# Patient Record
Sex: Male | Born: 1960 | Race: White | Hispanic: No | Marital: Single | Smoking: Never smoker
Health system: Southern US, Community
[De-identification: ages and names within clinical notes are randomized; demographics above are authoritative.]

## PROBLEM LIST (undated history)

## (undated) DIAGNOSIS — K219 Gastro-esophageal reflux disease without esophagitis: Secondary | ICD-10-CM

## (undated) DIAGNOSIS — R21 Rash and other nonspecific skin eruption: Secondary | ICD-10-CM

## (undated) DIAGNOSIS — D496 Neoplasm of unspecified behavior of brain: Secondary | ICD-10-CM

## (undated) DIAGNOSIS — M799 Soft tissue disorder, unspecified: Secondary | ICD-10-CM

## (undated) DIAGNOSIS — J342 Deviated nasal septum: Secondary | ICD-10-CM

## (undated) DIAGNOSIS — S46219A Strain of muscle, fascia and tendon of other parts of biceps, unspecified arm, initial encounter: Secondary | ICD-10-CM

## (undated) HISTORY — PX: OTHER SURGICAL HISTORY: SHX169

---

## 2005-02-03 DIAGNOSIS — E119 Type 2 diabetes mellitus without complications: Secondary | ICD-10-CM

## 2005-02-03 HISTORY — DX: Type 2 diabetes mellitus without complications: E11.9

## 2008-05-25 DIAGNOSIS — R9431 Abnormal electrocardiogram [ECG] [EKG]: Secondary | ICD-10-CM | POA: Insufficient documentation

## 2008-05-25 DIAGNOSIS — F411 Generalized anxiety disorder: Secondary | ICD-10-CM | POA: Insufficient documentation

## 2013-02-03 DIAGNOSIS — C801 Malignant (primary) neoplasm, unspecified: Secondary | ICD-10-CM

## 2013-02-03 HISTORY — DX: Malignant (primary) neoplasm, unspecified: C80.1

## 2013-08-24 DIAGNOSIS — E785 Hyperlipidemia, unspecified: Secondary | ICD-10-CM | POA: Insufficient documentation

## 2013-08-24 DIAGNOSIS — D329 Benign neoplasm of meninges, unspecified: Secondary | ICD-10-CM | POA: Insufficient documentation

## 2013-08-24 DIAGNOSIS — I10 Essential (primary) hypertension: Secondary | ICD-10-CM | POA: Insufficient documentation

## 2014-02-03 DIAGNOSIS — Z789 Other specified health status: Secondary | ICD-10-CM

## 2014-02-03 HISTORY — DX: Other specified health status: Z78.9

## 2017-09-16 ENCOUNTER — Ambulatory Visit (INDEPENDENT_AMBULATORY_CARE_PROVIDER_SITE_OTHER): Payer: BC Managed Care – PPO

## 2017-09-16 ENCOUNTER — Encounter (INDEPENDENT_AMBULATORY_CARE_PROVIDER_SITE_OTHER): Payer: Self-pay | Admitting: Sports Medicine

## 2017-09-16 ENCOUNTER — Ambulatory Visit (INDEPENDENT_AMBULATORY_CARE_PROVIDER_SITE_OTHER): Payer: BC Managed Care – PPO | Admitting: Sports Medicine

## 2017-09-16 VITALS — BP 140/84 | HR 98 | Ht 75.0 in | Wt 220.0 lb

## 2017-09-16 DIAGNOSIS — G8929 Other chronic pain: Secondary | ICD-10-CM

## 2017-09-16 DIAGNOSIS — M25512 Pain in left shoulder: Secondary | ICD-10-CM

## 2017-09-16 DIAGNOSIS — M25312 Other instability, left shoulder: Secondary | ICD-10-CM

## 2017-09-16 DIAGNOSIS — M25511 Pain in right shoulder: Secondary | ICD-10-CM
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
## 2017-09-24 ENCOUNTER — Ambulatory Visit: Payer: BC Managed Care – PPO | Attending: Sports Medicine

## 2017-09-24 DIAGNOSIS — M778 Other enthesopathies, not elsewhere classified: Secondary | ICD-10-CM | POA: Insufficient documentation

## 2017-09-24 DIAGNOSIS — M25512 Pain in left shoulder: Secondary | ICD-10-CM | POA: Insufficient documentation

## 2017-09-24 DIAGNOSIS — M25312 Other instability, left shoulder: Secondary | ICD-10-CM | POA: Insufficient documentation

## 2017-09-24 DIAGNOSIS — X58XXXA Exposure to other specified factors, initial encounter: Secondary | ICD-10-CM | POA: Insufficient documentation

## 2017-09-24 DIAGNOSIS — Z9889 Other specified postprocedural states: Secondary | ICD-10-CM | POA: Insufficient documentation

## 2017-09-24 DIAGNOSIS — M65811 Other synovitis and tenosynovitis, right shoulder: Secondary | ICD-10-CM | POA: Insufficient documentation

## 2017-09-24 DIAGNOSIS — M7551 Bursitis of right shoulder: Secondary | ICD-10-CM | POA: Insufficient documentation

## 2017-09-24 DIAGNOSIS — S46811A Strain of other muscles, fascia and tendons at shoulder and upper arm level, right arm, initial encounter: Secondary | ICD-10-CM | POA: Insufficient documentation

## 2017-09-24 DIAGNOSIS — S46812A Strain of other muscles, fascia and tendons at shoulder and upper arm level, left arm, initial encounter: Secondary | ICD-10-CM | POA: Insufficient documentation

## 2017-09-24 DIAGNOSIS — M25511 Pain in right shoulder: Secondary | ICD-10-CM | POA: Insufficient documentation

## 2017-09-24 DIAGNOSIS — M65812 Other synovitis and tenosynovitis, left shoulder: Secondary | ICD-10-CM | POA: Insufficient documentation

## 2017-09-24 DIAGNOSIS — M25811 Other specified joint disorders, right shoulder: Secondary | ICD-10-CM | POA: Insufficient documentation

## 2017-09-24 DIAGNOSIS — M19012 Primary osteoarthritis, left shoulder: Secondary | ICD-10-CM | POA: Insufficient documentation

## 2017-09-24 DIAGNOSIS — G8929 Other chronic pain: Secondary | ICD-10-CM | POA: Insufficient documentation

## 2017-09-24 DIAGNOSIS — E1165 Type 2 diabetes mellitus with hyperglycemia: Secondary | ICD-10-CM | POA: Insufficient documentation

## 2017-10-06 ENCOUNTER — Telehealth: Payer: BC Managed Care – PPO

## 2017-10-06 NOTE — Pre-Procedure Instructions (Addendum)
PSS interview scheduled for 1515, called 1514. Attemtped and started inteview. Unable to complete interview- it in MD office and no able to speak. Pt states he was not told it would take 30-45 minutes. Pt asked to be called back in 45 min, unable to  accommodate pt's request.  [facility] Liaision office emailed to reschedule pt for later today or tomorrow if available oer pt request.   [facility] and Posting faxed for MRSA hx noted in Ortho LOV 09/16/2017 in Epic Notes.

## 2017-10-07 ENCOUNTER — Ambulatory Visit (INDEPENDENT_AMBULATORY_CARE_PROVIDER_SITE_OTHER): Payer: BC Managed Care – PPO | Admitting: Sports Medicine

## 2017-10-07 VITALS — BP 138/90 | HR 91 | Ht 75.0 in | Wt 220.0 lb

## 2017-10-07 DIAGNOSIS — M7522 Bicipital tendinitis, left shoulder: Secondary | ICD-10-CM

## 2017-10-07 DIAGNOSIS — M75122 Complete rotator cuff tear or rupture of left shoulder, not specified as traumatic: Secondary | ICD-10-CM

## 2017-10-07 DIAGNOSIS — M7542 Impingement syndrome of left shoulder: Secondary | ICD-10-CM
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
## 2017-10-08 ENCOUNTER — Other Ambulatory Visit (INDEPENDENT_AMBULATORY_CARE_PROVIDER_SITE_OTHER): Payer: Self-pay | Admitting: Sports Medicine

## 2017-10-08 DIAGNOSIS — M7542 Impingement syndrome of left shoulder: Secondary | ICD-10-CM

## 2017-10-08 DIAGNOSIS — M7522 Bicipital tendinitis, left shoulder: Secondary | ICD-10-CM

## 2017-10-08 DIAGNOSIS — M75122 Complete rotator cuff tear or rupture of left shoulder, not specified as traumatic: Secondary | ICD-10-CM

## 2017-10-08 NOTE — Pre-Procedure Instructions (Signed)
Surgical scheduler notified of patient hx/o MRSA, message left on VM. Pt will need to be on isolation

## 2017-10-09 NOTE — Pre-Procedure Instructions (Signed)
Call placed to PCP office of Dr Ad-Dalli - preop/labs/ekg will be faxed to [facility].  Call placed to neuro office. Spoke to [place]. LOV was faxed a few minutes ago.

## 2017-10-09 NOTE — Pre-Procedure Instructions (Signed)
   Call to pcp office, staff will fax echo.

## 2017-10-09 NOTE — Pre-Procedure Instructions (Signed)
   DB  10/12/17 surgery interview done.   Per PMD medical clearance, ekg, MRSA-nose only, cbc. cmp will fax to [facility].   Spoke with Endo office they will fax lov 09/24/17 and A1c  09/07/17 (was 9.5). email sent to [facility].   Requested lov from neurology to be faxed to [facility]   Attempting to remove MRSA Flag will order MRSA n/t DOS per PMD nasal only done.

## 2017-10-12 ENCOUNTER — Ambulatory Visit: Payer: BC Managed Care – PPO | Admitting: Anesthesiology

## 2017-10-12 ENCOUNTER — Ambulatory Visit
Admission: RE | Admit: 2017-10-12 | Discharge: 2017-10-12 | Disposition: A | Discharge status: Home / Self Care | Payer: BC Managed Care – PPO | Source: Ambulatory Visit | Attending: Otolaryngology | Admitting: Otolaryngology

## 2017-10-12 ENCOUNTER — Encounter
Admission: RE | Disposition: A | Discharge status: Home / Self Care | Payer: Self-pay | Source: Ambulatory Visit | Attending: Otolaryngology

## 2017-10-12 DIAGNOSIS — Z01818 Encounter for other preprocedural examination: Secondary | ICD-10-CM

## 2017-10-12 DIAGNOSIS — J343 Hypertrophy of nasal turbinates: Secondary | ICD-10-CM | POA: Insufficient documentation

## 2017-10-12 DIAGNOSIS — E1140 Type 2 diabetes mellitus with diabetic neuropathy, unspecified: Secondary | ICD-10-CM | POA: Insufficient documentation

## 2017-10-12 DIAGNOSIS — J342 Deviated nasal septum: Secondary | ICD-10-CM | POA: Insufficient documentation

## 2017-10-12 DIAGNOSIS — J3489 Other specified disorders of nose and nasal sinuses: Secondary | ICD-10-CM | POA: Insufficient documentation

## 2017-10-12 DIAGNOSIS — Z7984 Long term (current) use of oral hypoglycemic drugs: Secondary | ICD-10-CM | POA: Insufficient documentation

## 2017-10-12 DIAGNOSIS — E1165 Type 2 diabetes mellitus with hyperglycemia: Secondary | ICD-10-CM | POA: Insufficient documentation

## 2017-10-12 DIAGNOSIS — K219 Gastro-esophageal reflux disease without esophagitis: Secondary | ICD-10-CM | POA: Insufficient documentation

## 2017-10-12 HISTORY — DX: Rash and other nonspecific skin eruption: R21

## 2017-10-12 HISTORY — DX: Gastro-esophageal reflux disease without esophagitis: K21.9

## 2017-10-12 HISTORY — DX: Soft tissue disorder, unspecified: M79.9

## 2017-10-12 HISTORY — DX: Neoplasm of unspecified behavior of brain: D49.6

## 2017-10-12 HISTORY — DX: Deviated nasal septum: J34.2

## 2017-10-12 HISTORY — DX: Strain of muscle, fascia and tendon of other parts of biceps, unspecified arm, initial encounter: S46.219A

## 2017-10-12 LAB — GLUCOSE WHOLE BLOOD - POCT
Whole Blood Glucose POCT: 157 mg/dL — ABNORMAL HIGH (ref 70–100)
Whole Blood Glucose POCT: 169 mg/dL — ABNORMAL HIGH (ref 70–100)

## 2017-10-12 SURGERY — SEPTOPLASTY, NOSE, WITH NASAL TURBINATE SUBMUCOUS EXCISION
Anesthesia: Anesthesia General | Site: Nose | Wound class: Clean Contaminated

## 2017-10-12 MED ORDER — OXYCODONE HCL 5 MG PO TABS
5.0000 mg | ORAL_TABLET | Freq: Once | ORAL | Status: AC | PRN
Start: 2017-10-12 — End: 2017-10-12

## 2017-10-12 MED ORDER — ROCURONIUM BROMIDE 50 MG/5ML IV SOLN
INTRAVENOUS | Status: AC
Start: 2017-10-12 — End: ?
  Filled 2017-10-12: qty 5

## 2017-10-12 MED ORDER — GABAPENTIN 300 MG PO CAPS
300.0000 mg | ORAL_CAPSULE | Freq: Once | ORAL | Status: AC
Start: 2017-10-12 — End: 2017-10-12
  Administered 2017-10-12: 300 mg via ORAL

## 2017-10-12 MED ORDER — MUPIROCIN 2 % EX OINT
TOPICAL_OINTMENT | CUTANEOUS | Status: DC | PRN
Start: 2017-10-12 — End: 2017-10-12
  Administered 2017-10-12: 1 g via TOPICAL

## 2017-10-12 MED ORDER — LIDOCAINE-EPINEPHRINE 1 %-1:100000 IJ SOLN
INTRAMUSCULAR | Status: AC
Start: 2017-10-12 — End: ?
  Filled 2017-10-12: qty 20

## 2017-10-12 MED ORDER — GLYCOPYRROLATE 0.2 MG/ML IJ SOLN
INTRAMUSCULAR | Status: DC | PRN
Start: 2017-10-12 — End: 2017-10-12
  Administered 2017-10-12: 0.4 mg via INTRAVENOUS

## 2017-10-12 MED ORDER — FAMOTIDINE 10 MG/ML IV SOLN (WRAP)
20.0000 mg | Freq: Once | INTRAVENOUS | Status: AC
Start: 2017-10-12 — End: 2017-10-12
  Administered 2017-10-12: 20 mg via INTRAVENOUS

## 2017-10-12 MED ORDER — GABAPENTIN 300 MG PO CAPS
ORAL_CAPSULE | ORAL | Status: AC
Start: 2017-10-12 — End: ?
  Filled 2017-10-12: qty 1

## 2017-10-12 MED ORDER — GLYCOPYRROLATE 0.2 MG/ML IJ SOLN
INTRAMUSCULAR | Status: AC
Start: 2017-10-12 — End: ?
  Filled 2017-10-12: qty 1

## 2017-10-12 MED ORDER — FAMOTIDINE 20 MG/2ML IV SOLN
INTRAVENOUS | Status: AC
Start: 2017-10-12 — End: ?
  Filled 2017-10-12: qty 2

## 2017-10-12 MED ORDER — PROPOFOL 10 MG/ML IV EMUL (WRAP)
INTRAVENOUS | Status: AC
Start: 2017-10-12 — End: ?
  Filled 2017-10-12: qty 20

## 2017-10-12 MED ORDER — LACTATED RINGERS IV SOLN
50.0000 mL/h | INTRAVENOUS | Status: DC
Start: 2017-10-12 — End: 2017-10-12

## 2017-10-12 MED ORDER — NEOSTIGMINE METHYLSULFATE 1 MG/ML IJ/IV SOLN (WRAP)
Status: AC
Start: 2017-10-12 — End: ?
  Filled 2017-10-12: qty 5

## 2017-10-12 MED ORDER — PROPOFOL INFUSION 10 MG/ML
INTRAVENOUS | Status: DC | PRN
Start: 2017-10-12 — End: 2017-10-12
  Administered 2017-10-12: 200 mg via INTRAVENOUS

## 2017-10-12 MED ORDER — DEXAMETHASONE SODIUM PHOSPHATE 4 MG/ML IJ SOLN (WRAP)
INTRAMUSCULAR | Status: DC | PRN
Start: 2017-10-12 — End: 2017-10-12
  Administered 2017-10-12: 4 mg via INTRAVENOUS

## 2017-10-12 MED ORDER — PROMETHAZINE HCL 25 MG/ML IJ SOLN
6.2500 mg | Freq: Once | INTRAMUSCULAR | Status: DC | PRN
Start: 2017-10-12 — End: 2017-10-12

## 2017-10-12 MED ORDER — EPINEPHRINE HCL (NASAL) 0.1 % NA SOLN
NASAL | Status: AC
Start: 2017-10-12 — End: ?
  Filled 2017-10-12: qty 30

## 2017-10-12 MED ORDER — MIDAZOLAM HCL 2 MG/2ML IJ SOLN
INTRAMUSCULAR | Status: DC | PRN
Start: 2017-10-12 — End: 2017-10-12
  Administered 2017-10-12: 2 mg via INTRAVENOUS

## 2017-10-12 MED ORDER — ACETAMINOPHEN 500 MG PO TABS
ORAL_TABLET | ORAL | Status: AC
Start: 2017-10-12 — End: ?
  Filled 2017-10-12: qty 2

## 2017-10-12 MED ORDER — ONDANSETRON HCL 4 MG/2ML IJ SOLN
INTRAMUSCULAR | Status: AC
Start: 2017-10-12 — End: ?
  Filled 2017-10-12: qty 2

## 2017-10-12 MED ORDER — DIPHENHYDRAMINE HCL 50 MG/ML IJ SOLN
12.5000 mg | Freq: Once | INTRAMUSCULAR | Status: DC | PRN
Start: 2017-10-12 — End: 2017-10-12

## 2017-10-12 MED ORDER — GLYCOPYRROLATE 0.2 MG/ML IJ SOLN
INTRAMUSCULAR | Status: AC
Start: 2017-10-12 — End: ?
  Filled 2017-10-12: qty 2

## 2017-10-12 MED ORDER — LIDOCAINE HCL (PF) 2 % IJ SOLN
INTRAMUSCULAR | Status: AC
Start: 2017-10-12 — End: ?
  Filled 2017-10-12: qty 5

## 2017-10-12 MED ORDER — ONDANSETRON HCL 4 MG/2ML IJ SOLN
INTRAMUSCULAR | Status: DC | PRN
Start: 2017-10-12 — End: 2017-10-12
  Administered 2017-10-12: 4 mg via INTRAVENOUS

## 2017-10-12 MED ORDER — FENTANYL CITRATE (PF) 50 MCG/ML IJ SOLN (WRAP)
INTRAMUSCULAR | Status: AC
Start: 2017-10-12 — End: ?
  Filled 2017-10-12: qty 2

## 2017-10-12 MED ORDER — LIDOCAINE-EPINEPHRINE 1 %-1:100000 IJ SOLN
INTRAMUSCULAR | Status: DC | PRN
Start: 2017-10-12 — End: 2017-10-12
  Administered 2017-10-12: 7 mL

## 2017-10-12 MED ORDER — DEXAMETHASONE SODIUM PHOSPHATE 20 MG/5ML IJ SOLN
INTRAMUSCULAR | Status: AC
Start: 2017-10-12 — End: ?
  Filled 2017-10-12: qty 5

## 2017-10-12 MED ORDER — HYDROCODONE-ACETAMINOPHEN 5-325 MG PO TABS
1.00 | ORAL_TABLET | Freq: Four times a day (QID) | ORAL | 0 refills | Status: AC | PRN
Start: 2017-10-12 — End: 2017-10-19

## 2017-10-12 MED ORDER — ONDANSETRON HCL 4 MG/2ML IJ SOLN
4.0000 mg | Freq: Once | INTRAMUSCULAR | Status: DC | PRN
Start: 2017-10-12 — End: 2017-10-12

## 2017-10-12 MED ORDER — MIDAZOLAM HCL 2 MG/2ML IJ SOLN
INTRAMUSCULAR | Status: AC
Start: 2017-10-12 — End: ?
  Filled 2017-10-12: qty 2

## 2017-10-12 MED ORDER — NEOSTIGMINE METHYLSULFATE 1 MG/ML IJ/IV SOLN (WRAP)
Status: DC | PRN
Start: 2017-10-12 — End: 2017-10-12
  Administered 2017-10-12: 3 mg via INTRAVENOUS

## 2017-10-12 MED ORDER — MEPERIDINE HCL 25 MG/ML IJ SOLN
12.5000 mg | Freq: Once | INTRAMUSCULAR | Status: DC | PRN
Start: 2017-10-12 — End: 2017-10-12

## 2017-10-12 MED ORDER — EPINEPHRINE HCL (NASAL) 0.1 % NA SOLN
NASAL | Status: DC | PRN
Start: 2017-10-12 — End: 2017-10-12
  Administered 2017-10-12: 35 mL via NASAL

## 2017-10-12 MED ORDER — OXYCODONE HCL 5 MG PO TABS
ORAL_TABLET | ORAL | Status: AC
Start: 2017-10-12 — End: 2017-10-12
  Administered 2017-10-12: 5 mg via ORAL
  Filled 2017-10-12: qty 1

## 2017-10-12 MED ORDER — CLINDAMYCIN PHOSPHATE IN D5W 600 MG/50ML IV SOLN
INTRAVENOUS | Status: AC
Start: 2017-10-12 — End: ?
  Filled 2017-10-12: qty 50

## 2017-10-12 MED ORDER — LIDOCAINE HCL 2 % IJ SOLN
INTRAMUSCULAR | Status: DC | PRN
Start: 2017-10-12 — End: 2017-10-12
  Administered 2017-10-12: 80 mg via INTRAVENOUS

## 2017-10-12 MED ORDER — LACTATED RINGERS IV SOLN
INTRAVENOUS | Status: DC
Start: 2017-10-12 — End: 2017-10-12
  Administered 2017-10-12: 20 mL/h via INTRAVENOUS

## 2017-10-12 MED ORDER — FENTANYL CITRATE (PF) 50 MCG/ML IJ SOLN (WRAP)
INTRAMUSCULAR | Status: DC | PRN
Start: 2017-10-12 — End: 2017-10-12
  Administered 2017-10-12: 50 ug via INTRAVENOUS

## 2017-10-12 MED ORDER — CLINDAMYCIN PHOSPHATE IN D5W 600 MG/50ML IV SOLN
600.00 mg | INTRAVENOUS | Status: AC
Start: 2017-10-12 — End: 2017-10-12
  Administered 2017-10-12: 600 mg via INTRAVENOUS

## 2017-10-12 MED ORDER — AMMONIA AROMATIC IN INHA
1.0000 | Freq: Once | RESPIRATORY_TRACT | Status: DC | PRN
Start: 2017-10-12 — End: 2017-10-12

## 2017-10-12 MED ORDER — ACETAMINOPHEN 500 MG PO TABS
1000.0000 mg | ORAL_TABLET | Freq: Once | ORAL | Status: AC
Start: 2017-10-12 — End: 2017-10-12
  Administered 2017-10-12: 1000 mg via ORAL

## 2017-10-12 MED ORDER — EPHEDRINE SULFATE 50 MG/ML IJ/IV SOLN (WRAP)
Status: AC
Start: 2017-10-12 — End: ?
  Filled 2017-10-12: qty 1

## 2017-10-12 MED ORDER — ROCURONIUM BROMIDE 10 MG/ML IV SOLN (WRAP)
INTRAVENOUS | Status: DC | PRN
Start: 2017-10-12 — End: 2017-10-12
  Administered 2017-10-12: 40 mg via INTRAVENOUS

## 2017-10-12 MED ORDER — MUPIROCIN 2 % EX OINT
TOPICAL_OINTMENT | CUTANEOUS | Status: AC
Start: 2017-10-12 — End: ?
  Filled 2017-10-12: qty 22

## 2017-10-12 MED ORDER — FENTANYL CITRATE (PF) 50 MCG/ML IJ SOLN (WRAP)
25.0000 ug | INTRAMUSCULAR | Status: DC | PRN
Start: 2017-10-12 — End: 2017-10-12

## 2017-10-12 MED ORDER — PHENYLEPHRINE HCL 10 MG/ML IV SOLN (WRAP)
Status: DC | PRN
Start: 2017-10-12 — End: 2017-10-12
  Administered 2017-10-12 (×2): 100 ug via INTRAVENOUS

## 2017-10-12 MED ORDER — EPHEDRINE SULFATE 50 MG/ML IJ/IV SOLN (WRAP)
Status: DC | PRN
Start: 2017-10-12 — End: 2017-10-12
  Administered 2017-10-12: 5 mg via INTRAVENOUS

## 2017-10-12 SURGICAL SUPPLY — 29 items
BLADE INFERIOR TURBINATE 2MM (ENT Supplies) ×1
BLADE S/SU RIBBACK CARB STL 15 (Blade) ×2 IMPLANT
BLADE SHAVER OD2 MM STRAIGHT SHAFT (ENT Supplies) ×1
BLADE SHAVER OD2 MM STRAIGHT SHAFT ELEVATOR IRRIGATION TUBE (ENT Supplies) ×1 IMPLANT
GLOVE SURG BIOGEL SZ7.5 (Glove) ×2 IMPLANT
SLEEVE CMPR MED KN LGTH KDL SCD 21- IN (Sleeve) ×1
SLEEVE COMPRESSION MEDIUM KNEE LENGTH KENDALL SEQUENTIAL OD21- IN (Sleeve) ×1 IMPLANT
SLEEVE SEQ COMP KNEE REGULAR (Sleeve) ×1
SPLINT DOYLE II INTRANASAL (Splint) ×2 IMPLANT
STAPLER SEPTAL ENTACT (Staplers) ×1
STAPLER SEPTAL ENTACT INTERNAL (Staplers) ×1 IMPLANT
SUCTION COAGULATOR FOOT CNTRL (Suction) ×2 IMPLANT
SUTURE CHROMIC 4-0 RB1 27IN (Suture) ×1
SUTURE CHROMIC 4-0 SC1 SC118IN (Suture) ×1
SUTURE CHROMIC GUT CHROMIC 4-0 RB-1 L27 (Suture) ×1
SUTURE CHROMIC GUT CHROMIC 4-0 RB-1 L27 IN MONOFILAMENT BROWN (Suture) ×1 IMPLANT
SUTURE PLAIN 4-0 SC-1 (Suture) ×1
SUTURE PLAIN GUT GUT PLAIN 4-0 SC-1 L18 (Suture) ×1
SUTURE PLAIN GUT GUT PLAIN 4-0 SC-1 L18 IN MONOFILAMENT YELLOWISH TAN (Suture) ×1 IMPLANT
SUTURE PLAIN GUT PLAIN 4-0 SC-1 L18 IN 2 (Suture) ×1
SUTURE PLAIN GUT PLAIN 4-0 SC-1 L18 IN 2 ARM MONOFILAMENT YELLOWISH (Suture) ×1 IMPLANT
SUTURE PROLENE 3-0 KS 30IN (Suture) ×2 IMPLANT
SYRINGE 10 ML CONTROL CONCENTRIC TIP (Syringes, Needles) ×1
SYRINGE 10 ML CONTROL CONCENTRIC TIP PYROGEN FREE DEHP FREE LOK (Syringes, Needles) ×1 IMPLANT
SYRINGE LUER-LOK CONTROL 10ML (Syringes, Needles) ×1
TRAY NASAL ORAL HEAD AND NECK (Pack) ×2 IMPLANT
TUBING CONNECTING STERILE 10FT (Tubing) ×1
TUBING SUCTION ID3/16 IN L10 FT (Tubing) ×1
TUBING SUCTION ID3/16 IN L10 FT NONCONDUCTIVE STRAIGHT MALE FEMALE (Tubing) ×1 IMPLANT

## 2017-10-12 NOTE — Anesthesia Preprocedure Evaluation (Signed)
Anesthesia Evaluation    AIRWAY    Mallampati: III    TM distance: >3 FB  Neck ROM: full  Mouth Opening:full   CARDIOVASCULAR    regular and normal       DENTAL    no notable dental hx    (+) implants   PULMONARY    clear to auscultation     OTHER FINDINGS                  Relevant Problems   No relevant active problems       PSS Anesthesia Comments:   Deviated nasal septum  DM  GERD        Anesthesia Plan    ASA 3     general                     Detailed anesthesia plan: general endotracheal        Post op pain management: per surgeon    informed consent obtained                   Signed by: Duard Larsen 10/12/17 11:40 AM

## 2017-10-12 NOTE — Addendum Note (Signed)
Addendum  created 10/12/17 1849 by Alycia Patten, CRNA    Anesthesia Event edited

## 2017-10-12 NOTE — Transfer of Care (Signed)
Anesthesia Transfer of Care Note    Patient: Daniel Hebert    Procedures performed: Procedure(s) with comments:  SEPTOPLASTY SMR, TURBINATES - SEPTOPLASTY, INFERIOR TURBINATE RESECTION    Anesthesia type: General ETT    Patient location:Phase I PACU    Last vitals:   Vitals:    10/12/17 1402   BP: (!) 155/96   Pulse: 83   Resp: 20   Temp: (!) 36 C (96.8 F)   SpO2: 97%       Post pain: Patient not complaining of pain, continue current therapy      Mental Status:awake    Respiratory Function: tolerating room air    Cardiovascular: stable    Nausea/Vomiting: patient not complaining of nausea or vomiting    Hydration Status: adequate    Post assessment: no apparent anesthetic complications, no reportable events and no evidence of recall    Signed by: Alycia Patten  10/12/17 2:02 PM

## 2017-10-12 NOTE — Anesthesia Postprocedure Evaluation (Signed)
Anesthesia Post Evaluation    Patient: Daniel Hebert    Procedure(s) with comments:  SEPTOPLASTY SMR, TURBINATES - SEPTOPLASTY, INFERIOR TURBINATE RESECTION    Anesthesia type: general    Last Vitals:   Vitals:    10/12/17 1500   BP: 147/85   Pulse: (!) 117   Resp: 18   Temp: 36.1 C (97 F)   SpO2: 98%       Anesthesia Post Evaluation:                   Anesthetic complications: No                      Anesthesia Qualified Clinical Data Registry 2018    PACU Reintubation  Did the Patient have general anesthesia with intubation: Yes  Did the Patient require reintubation in the PACU?: No      PONV Adult  Is the patient aged 71 or older: Yes  Did the patient receive recieve a general anesthestic: Yes  Does the patient have 3 or more risk factors for PONV? No        PONV Pediatric  Is the patient aged 28-17? No            PACU Transfer Checklist Protocol  Was the patient transferred to the PACU at the conclusion of surgery? Yes  Was a checklist or transfer protocol used? Yes    ICU Transfer Checklist Protocol  Was the patient transferred to the ICU at the conclusion of surgery? No      Post-op Pain Assessment Prior to Anesthesia Care End  Age >=18 and assessed for pain in PACU: Yes        Perioperative Mortality  Perioperative mortality prior to Anesthesia end time: No    Perioperative Cardiac Arrest  Did the patient have an unanticipated intraoperative cardiac arrest between anesthesia start time and anesthesia end time? No    Unplanned Admission to ICU  Did the patient have an unplanned admission to the ICU (not initially anticipated at anesthesia start time)? No      Signed by: Duard Larsen, 10/12/2017 3:33 PM

## 2017-10-12 NOTE — H&P (Signed)
Patient seen and examined.  No changes to scanned in H&P.  Plan to proceed with septoplasty and bilateral inferior turbinate reduction.  Patient understands benefits, risks, alternatives to procedure.    H. Taren Dymek

## 2017-10-12 NOTE — Discharge Instructions (Signed)
Anesthesia Discharge Instructions: After Your Surgery  You've just had surgery. During surgery you were given medicine called anesthesia to keep you relaxed and comfortable. After surgery you may have some pain or nausea. This is common. Your doctor or nurse will show you how to take care of yourself when you go home. He or she will also answer your questions. Here are some tips for feeling better and getting well after surgery.        For the first 24 hours after your surgery:   Do not drive or use heavy equipment.    Do not make important decisions or sign legal papers.   Do not drink alcohol.   Have someone stay with you, if needed. He or she can watch for problems and help keep you safe.   Have an adult family member or friend drive you home.    Managing pain  If you have pain after surgery, pain medicine will help you feel better. Take it as ordered by your surgeon, before pain becomes severe. Also, ask your doctor about other ways to control pain. This might be with rest, ice, repositioning, and elevation. Follow any other instructions your surgeon or nurse gives you.    Tips for taking pain medicine:    Stay on schedule with your medication   Most pain relievers taken by mouth need at least 20 to 30 minutes to start to work.   Taking medicine on a schedule can help you remember to take it. Try to time your medicine so that you can take it before starting an activity. This might be before you get dressed, go for a walk, or sit down for dinner.    Common side effects of prescription pain medications   Pain medicines can cause nausea. Eat a little food before taking pain medicine to avoid this.   Constipation is a common side effect of pain medicines. Drinking lots of fluids and eating foods, such as fruits and vegetables, that are high in fiber can also help.    Call your doctor before taking any medicines such as laxatives or stool softeners to help ease constipation. Also, ask if you should avoid  any foods. Remember, do not take laxatives unless your surgeon has prescribed them   Drinking alcohol and taking pain medicines can cause dizziness and slow your breathing. It can even be deadly. Do not drink alcohol while taking pain medicines.   Pain medicines can make you react more slowly to things. Do not drive or run machinery while taking pain medicines.    Important facts about Acetaminophen (Tylenol)   Acetaminophen or Tylenol is a common pain reliever.   Check your prescription pain medication labels to see if it contains acetaminophen.   Your health care provider may tell you to take acetaminophen to help ease your pain. Ask him or her how much you should to take each day.    Some prescription pain medicines have acetaminophen and other ingredients. Using both prescription pain medicines and over the counter (OTC) acetaminophen for pain can cause you to overdose.   Max dose of acetaminophen is 4000mg/24 hours for most people. Overdosing on acetaminophen may lead to liver failure.   Read the labels on your (over the counter) medicines with care. This will help you to clearly know the list of ingredients, how much to take, and any warnings. This will prevent you from taking too much acetaminophen.   If you have questions or do not understand the   information, ask your pharmacist or health care provider to explain it to you before you take the OTC medicine.    Managing nausea  Some people have an upset stomach after surgery. This is often because of anesthesia, pain, pain medicines, or the stress of surgery. If you were on a special food plan before surgery, ask your doctor if you should follow it while you get better. The following are tips to help you manage nausea after anesthesia:   Do not push yourself to eat. Your body will tell you when to eat and how much.   Start off with clear liquids and soup. They are easier to digest.   Next try semi-solid foods such as mashed potatoes, applesauce,  and gelatin, as you feel ready.   Slowly move to solid foods. Don't eat fatty, rich, or spicy foods at first.   Do not force yourself to have 3 large meals a day. Instead eat smaller amounts more often.   Take pain medicines with a small amount of food, such as crackers or toast, to avoid nausea.     Call your surgeon if:   You have worsening pain an hour after taking pain medicine. The pain medicine may not be strong enough.   You feel too sleepy, dizzy, or groggy. The pain medicine may be too strong.   You have side effects like persistent nausea, vomiting, or skin changes such as rash, itching, or hives.    You have bleeding through your dressing or your dressing becomes saturated.   You have signs of infection such as redness, fever, or increased/foul smelling drainage.      If you have obstructive sleep apnea (OSA)  You were given anesthesia medicine during surgery to keep you comfortable and free of pain. After surgery, you may have more apnea spells because of this medicine and other medicines you were given. The spells may last longer than usual.   At home:   Keep using the continuous positive airway pressure (CPAP) device when you sleep. Unless your health care provider tells you not to, use it when you sleep,        or take a nap; day or night. CPAP is a common device used to treat OSA.  You are to sleep upright- with HOB > 30 degrees, with pillows; or use a recliner          for the first week or while on opioids or medications that are sedating.  Another adult needs to be with you during first 24 hours home after surgery.  Limit opioid use when possible and use alternative comfort measures.  Talk with your provider before taking any pain medicine, muscle relaxants, or sedatives. Your provider will tell you about the possible dangers of taking these medicines.      Nasal Surgery: Septoplasty    You're scheduled to have nasal surgery. The type of nasal surgery you're having is called septoplasty.  Read on to learn more about what to expect during this surgery.During surgery, the surgeon may remove cartilage and boneto reshape the deviated septum. After surgery, there is more breathing space. Enough cartilage and bone remain to give the nose support.  What to expect during septoplasty  This surgery repairs a blockage inside the nose caused by a deviated septum. With a deviated septum, there is a problem with the wall that divides the nose into two chambers. A deviated septum may block air coming through one or both nostrils. This makes it harder  for you to breathe through your nose. During septoplasty, the surgeon makesincisionsinside the nose. Then the surgeon trims, reshapes, moves, or removescartilage and sometimes bone from the septum.  Risks and possible complications  As with any surgery, nasal surgery has some risks. These include a slight risk of bleeding and infection. Your doctor will discuss any other risks and complications with you.   After septoplasty  After septoplasty, you'll be taken to a recovery area or to your hospital room. Your experience may be as follows:   You may have packing material inside your nose. This reduces bleeding and promotes healing. You may also have bandages (dressings) on the outside of your nose.   It's normal to have some mucus and blood drain from your nose. Until packing is removed, you may have to breathe through your mouth.   You may have some swelling or bruising around the eyelids if a rhinoplasty was also done.   Expect some throat dryness and irritation.   Pain medicine will be prescribed as needed. Don't take medicine that contains aspirin or ibuprofen. These can cause increased bleeding.  Follow-up care  You'll need to follow up with your doctor within a week after your surgery. Here is what to expect:   Any packing, splint, or dressings will probably be removed. You may feel slight discomfort and bleed a little when this is done.   After the  splint or packing is removed, you'll most likely breathe better than you did before surgery.   You may have minor numbness, pain, swelling, and a little stiffness under the tip of the nose.   In a few days, the inside of your nose may swell and briefly block your breathing. Or a scab or crust may block breathing for a short time. Leave the scab alone. Your doctor can remove it. Using saline (irrigation or aerosol) regularly after surgery helps to reduce the amount of crusting at each visit.   Contact your healthcare provider if you have any questions or concerns.  Date Last Reviewed: 12/05/2014   2000-2019 [employer], [place]. [address], [place], [place] [address]. All rights reserved. This information is not intended as a substitute for professional medical care. Always follow your healthcare professional's instructions.

## 2017-10-12 NOTE — Discharge Instr - AVS First Page (Signed)
See references.  Expect bleeding for 24-48 hrs.

## 2017-10-12 NOTE — Op Note (Signed)
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  Patient offered septoplasty with inferior turbinate reduction after nasal obstruction not controlled with medical therapy.  The benefits and risks of the procedure were explained to the patient.  Risks include pain, bleeding, infection, persistent deviation, scar tissue, septal perforation, numbness in teeth/lips, CSF leak and other unforseen risks.  Patient freely consented to procedure.      Operative Notes:   The patient was brought into the operating room and placed supine on the operating room table. After appropriate timeout was performed, the patient was turned over to the anesthesia service for successful induction of general anesthesia and placement of an endotracheal tube. The patient then was turned back to the otolaryngology service for the duration of the procedure. The vibrissae were trimmed and 8cc 1% Lidocaine with 1:100,000 epinepherine injected into the septum and inferior turbinates. 1:1000 topical Adrenaline was used to decongest and vasoconstrict.     Attention was first focused to the septum. A hemitransfixion incision was made on the left side and dissection was carried down to the cartilage and mucoperichondrial flap was raised on the left side.  A 15 blade was then used to incise the cartilage and  the contralateral mucoperichondrial flap was raised. The deviated portion of cartilage and bone were then removed using septal scissors and osteotomes. Careful attention was paid to make a 1.5 cm "L-strut" to ensure nasal support. A zero degree endoscope was used to assure proper nasal airway.     Attention was then focused to the inferior turbinates. The zero degree endoscope was brought into the field The right inferior turbinate was infractured with a freer elevator and then a microdebrider with inferior turbinate blade was used to puncture the mucoperiosteum and resect bulkiness of turbinates. The turbinate was noted to be much smaller and then outfractured. Similar procedure was done on the left side with infracturing followed by debridement of bulky turbinates and then outfracturing.     The hemitransfixion incision was then reapproximated using 4-0 chromic in an interrupted fashion. The septum was then quilted back together using a vicryl septal stapler. Doyle splints were placed on either side of the mucoperichondrial flaps and sutured in place using a 3-0 Prolene suture. The patient was then turned over to anesthesia service and awoken and extubated.  He tolerated the procedure well. He was transferred to recovery in a stable fashion.         Estimated Blood Loss:   * No values recorded between 10/12/2017 12:36 PM and 10/12/2017  1:54 PM *    Implants:   * No implants in log *    Drains:   Drains: no    Specimens:   *  No specimens in log *      Complications:   None    Signed by: Neldon Labella, MD

## 2017-10-13 ENCOUNTER — Encounter: Payer: Self-pay | Admitting: Otolaryngology

## 2017-10-13 LAB — MRSA CULTURE
Culture MRSA Surveillance: NEGATIVE
Culture MRSA Surveillance: NEGATIVE

## 2017-10-27 ENCOUNTER — Ambulatory Visit: Admit: 2017-10-27 | Payer: BC Managed Care – PPO | Admitting: Sports Medicine

## 2017-10-27 SURGERY — ARTHROSCOPIC SHOULDER, ROTATOR CUFF REPAIR
Anesthesia: General | Site: Shoulder | Laterality: Left

## 2017-11-05 ENCOUNTER — Telehealth (INDEPENDENT_AMBULATORY_CARE_PROVIDER_SITE_OTHER): Payer: Self-pay

## 2017-11-09 ENCOUNTER — Ambulatory Visit (INDEPENDENT_AMBULATORY_CARE_PROVIDER_SITE_OTHER): Payer: BC Managed Care – PPO | Admitting: Orthopaedic Surgery

## 2017-11-09 ENCOUNTER — Encounter (INDEPENDENT_AMBULATORY_CARE_PROVIDER_SITE_OTHER): Payer: Self-pay | Admitting: Orthopaedic Surgery

## 2017-11-09 VITALS — BP 131/88 | HR 97 | Ht 75.0 in | Wt 218.0 lb

## 2017-11-09 DIAGNOSIS — M75122 Complete rotator cuff tear or rupture of left shoulder, not specified as traumatic: Secondary | ICD-10-CM

## 2017-11-11 NOTE — Progress Notes (Signed)
Chief complaint:bilateral shoulder pain    History:  57 year old male was seen my colleague for the bilateral shoulders is a history of right rotator cuff repair. recent MRI shows a left rotator cuff tear.  Dr. Lorra Hals had talked to him about repairing this.  The patient is concerned because he is having newer pain in his right biceps.  He feels that "something is wrong with the biceps".  He does not want to have anything done with the left shoulder, until the right shoulder is addressed.    Physical examination:  Alert and oriented male in no acute distress afebrile, vital signs stable.  Examination of the bilateral upper extremities reveals he is sensory and vascularly intact with 5 out of 5 strength in all major muscle groups.  Left shoulder shows pain with resisted forward elevation and some weakness.  Right shoulder shows previous incisions.  There does appear to be a clear Popeye deformity consistent with a proximal biceps tendon tenotomy.  He claims deformities knee.    Imaging: MR left shoulder shows rotator cuff tendon tear, acromioclavicular joint arthritis and biceps tendinitis.  MRI right shoulder shows evidence of previous rotator cuff repair, biceps tenotomy.     Impression and plan:  57 year old male left rotator cuff tendon tear.  He is concerned about the right shoulder.  Presumably he may have had a biceps tenodesis here at the time of his surgery, which has gone on to auto tenotomy.  I tried to explain that I do not think there is really anything surgical that can be done here.  I would certainly not try to repair the tenotomy. He was unhappy with this.  I'm going to give him some information for other shoulder specialists in the area to obtain a 3rd opinion.

## 2018-10-29 ENCOUNTER — Telehealth (INDEPENDENT_AMBULATORY_CARE_PROVIDER_SITE_OTHER): Payer: Self-pay

## 2018-10-29 NOTE — Telephone Encounter (Signed)
Daniel Hebert called: he has ongoing problems with his arm and shoulder and wants an IME to review the disability rating that was recently performed. He was having difficulties in finding an MD but has recently located one to perform a surgery. I referred him to [facility] or [facility] as our MDs do not perform the IMEs.

## 2018-11-21 DIAGNOSIS — F3160 Bipolar disorder, current episode mixed, unspecified: Secondary | ICD-10-CM | POA: Insufficient documentation

## 2018-11-21 DIAGNOSIS — J302 Other seasonal allergic rhinitis: Secondary | ICD-10-CM | POA: Insufficient documentation

## 2019-03-04 DIAGNOSIS — M7521 Bicipital tendinitis, right shoulder: Secondary | ICD-10-CM | POA: Insufficient documentation

## 2019-03-04 DIAGNOSIS — M12811 Other specific arthropathies, not elsewhere classified, right shoulder: Secondary | ICD-10-CM | POA: Insufficient documentation

## 2019-03-07 DIAGNOSIS — H547 Unspecified visual loss: Secondary | ICD-10-CM | POA: Insufficient documentation

## 2019-03-07 DIAGNOSIS — Z22322 Carrier or suspected carrier of Methicillin resistant Staphylococcus aureus: Secondary | ICD-10-CM | POA: Insufficient documentation

## 2019-03-15 DIAGNOSIS — Z9889 Other specified postprocedural states: Secondary | ICD-10-CM | POA: Insufficient documentation

## 2019-03-16 DIAGNOSIS — E119 Type 2 diabetes mellitus without complications: Secondary | ICD-10-CM | POA: Insufficient documentation

## 2019-03-17 ENCOUNTER — Ambulatory Visit (FREE_STANDING_LABORATORY_FACILITY): Payer: BC Managed Care – PPO

## 2019-03-17 DIAGNOSIS — Z01818 Encounter for other preprocedural examination: Secondary | ICD-10-CM

## 2019-03-17 DIAGNOSIS — Z20822 Contact with and (suspected) exposure to covid-19: Secondary | ICD-10-CM

## 2019-03-18 LAB — COVID-19 (SARS-COV-2): SARS CoV 2 Overall Result: NOT DETECTED

## 2019-04-14 DIAGNOSIS — H029 Unspecified disorder of eyelid: Secondary | ICD-10-CM | POA: Insufficient documentation

## 2019-05-14 ENCOUNTER — Ambulatory Visit (INDEPENDENT_AMBULATORY_CARE_PROVIDER_SITE_OTHER): Payer: BC Managed Care – PPO

## 2019-05-16 ENCOUNTER — Telehealth: Payer: Self-pay

## 2019-05-16 NOTE — Telephone Encounter (Addendum)
COVID-19 Test Results Call Center    Received call from [facility] for Dr. Carleene Cooper at [phone] requesting Carney Bern COVID-19 test results.    Verified patient's name, DOB, and address.     Reviewed chart and no COVID-19 test result in Epic at this time. Updated Toniann Fail no test result and she stated she will follow up with patient to inquire on his test.       Faylene Million BSN, RN  COVID-19 Notification Team  Direct [phone]  COVID-19 Call Center:[phone]

## 2019-09-02 DIAGNOSIS — M5414 Radiculopathy, thoracic region: Secondary | ICD-10-CM | POA: Insufficient documentation

## 2019-09-02 DIAGNOSIS — E1142 Type 2 diabetes mellitus with diabetic polyneuropathy: Secondary | ICD-10-CM | POA: Insufficient documentation

## 2019-09-02 DIAGNOSIS — M5136 Other intervertebral disc degeneration, lumbar region: Secondary | ICD-10-CM | POA: Insufficient documentation

## 2020-04-03 DIAGNOSIS — H02831 Dermatochalasis of right upper eyelid: Secondary | ICD-10-CM | POA: Insufficient documentation

## 2020-04-03 DIAGNOSIS — H02834 Dermatochalasis of left upper eyelid: Secondary | ICD-10-CM | POA: Insufficient documentation

## 2020-04-03 DIAGNOSIS — H57813 Brow ptosis, bilateral: Secondary | ICD-10-CM | POA: Insufficient documentation

## 2020-04-03 DIAGNOSIS — H02403 Unspecified ptosis of bilateral eyelids: Secondary | ICD-10-CM | POA: Insufficient documentation

## 2020-05-08 ENCOUNTER — Ambulatory Visit (INDEPENDENT_AMBULATORY_CARE_PROVIDER_SITE_OTHER): Payer: BC Managed Care – PPO | Admitting: Podiatry

## 2020-05-08 ENCOUNTER — Other Ambulatory Visit: Payer: Self-pay

## 2020-05-08 ENCOUNTER — Ambulatory Visit (INDEPENDENT_AMBULATORY_CARE_PROVIDER_SITE_OTHER): Payer: BC Managed Care – PPO

## 2020-05-08 DIAGNOSIS — E1165 Type 2 diabetes mellitus with hyperglycemia: Secondary | ICD-10-CM

## 2020-05-08 DIAGNOSIS — S92901A Unspecified fracture of right foot, initial encounter for closed fracture: Secondary | ICD-10-CM | POA: Diagnosis not present

## 2020-05-08 DIAGNOSIS — S93621S Sprain of tarsometatarsal ligament of right foot, sequela: Secondary | ICD-10-CM

## 2020-05-08 DIAGNOSIS — M14671 Charcot's joint, right ankle and foot: Secondary | ICD-10-CM | POA: Diagnosis not present

## 2020-05-08 DIAGNOSIS — E11610 Type 2 diabetes mellitus with diabetic neuropathic arthropathy: Secondary | ICD-10-CM | POA: Diagnosis not present

## 2020-05-11 ENCOUNTER — Ambulatory Visit: Payer: BC Managed Care – PPO | Admitting: Podiatry

## 2020-05-17 ENCOUNTER — Telehealth: Payer: Self-pay | Admitting: Podiatry

## 2020-05-17 NOTE — Telephone Encounter (Signed)
Pt is extremely upset with his care. Pt was in the office on 05/08/20 and was told that a MRI would be order. Pt called over to [facility] imaging to see if he could schedule his MRI and was told that there was not an order placed.   Pt stated that, Dr. March Rummage told him wanted his blood sugar to be lower. Pt has his sugars at a manageable level at this time. Pt feels that Dr. March Rummage doesn't want to complete his surgery. Pt doesn't want to lose his foot. Pt want ED Sunday night to get antibiotics due to a diabetic foot infection. Pt is in pain and he doesn't want to get Charcot foot. Pt has waited 9 weeks after coming from [facility]. MRI order needs to be place ASAP.

## 2020-05-17 NOTE — Telephone Encounter (Signed)
Pt called asking when his appt was to follow up with Dr March Rummage and I pulled it up and it was 4.26. Pt thought it appt was next week.He said he ended up going to the er and they put him on antibiotics and he almost lost his toe. He said he does not understand why Dr March Rummage never put him on antibiotics. He was also  upset stating he seen Dr March Rummage and he was to get a mri for pt and gave pt a paper to call and schedule but they do not have an order, why would he give him the paper to call if he never sent the order. I transferred to the administrator Keely.

## 2020-05-18 ENCOUNTER — Telehealth: Payer: Self-pay

## 2020-05-18 NOTE — Progress Notes (Signed)
Subjective:  Patient ID: Paul Braun, male    DOB: 1960-05-07,  MRN: [id number]  Chief Complaint  Patient presents with  . Foot Pain    Right foot 2nd to blister pt was diagnosed with charcot foot and had been seen by 5 different physicians.  Left foot pain near heel and arch.    60 y.o. male presents with the above complaint. History confirmed with patient. Reports complicated history of right foot injury. States that his fracture is now 62 years old and originally occurred 2/2 injury.  Has been previously diagnosed with Charcot joint and while surgery was discussed he was unable to find someone who would do his surgery. Per patient his A1c is around 9 previously down from 11.  Objective:  Physical Exam: warm, good capillary refill, no trophic changes or ulcerative lesions, normal DP and PT pulses and normal sensory exam. Right Foot:  POP dorsal midfoot with mild deformity.  Local edema.  No warmth or erythema. Ulceration to the right second toe measuring 0.2 x 0.4 without warmth erythema signs of acute infection. Granular wound base.  No images are attached to the encounter.  Radiographs: X-ray of the right foot: Second metatarsal fracture with lateral displacement of the metatarsals. No bone lysis noted at this area reflecting Charcot joint. No erosive changes of the digits. Assessment:   1. Charcot's joint of foot, right   2. Closed fracture of right foot, initial encounter   3. Lisfranc's sprain, right, sequela   4. Uncontrolled type 2 diabetes mellitus with hyperglycemia ([place])    Plan:  Patient was evaluated and treated and all questions answered.  Right foot Lisfranc fracture with previous diagnosis of Charcot however this diagnosis is unclear -While I do think he needs reconstruction we will order MRI for further planning.  I did discuss the patient that I am not sure that he has a true Charcot deformity and that he likely just experienced a Lisfranc fracture.  We discussed  that in order to proceed with surgery his sugars will need to be controlled.  We discussed that if the A1c was greater than 8 his chance of infection is very high and we cannot do that surgery.  I discussed I would be happy to help him and provide surgical care for this issue but he needs to have his sugars down below 8.  As this is a chronic issue greater than 2 years duration this is not urgent to warrant proceeding with surgery given absence of glycemic control.  Patient was very unhappy with this and states that his sugars are okay and he has had other surgeries that have healed fine and he should be okay to proceed with surgery.  I reiterated the risks and stated that I would not proceed with surgery for him without control of his sugars as the sequela of infection are much more severe than the present issue.  Patient again insisted he should be fine however I stated that he needs to work on his sugars and we can discuss more after his MRI.  He will likely need tarsometatarsal fusion but again I do not think this is secondary to Charcot. -Dispensed surgical shoe.  Offered boot but patient declined -After extensive discussion and spending greater than 30 minutes of direct face-to-face discussion with patient and patient given information for follow-up, provider ended the visit. After fitting patient for a shoe and before leaving patient told the medical assistant that he had several additional questions he would like to  discuss and I advised that due to time constraints we can discuss these at his next visit when we reviewed his MRI.  Right 2nd toe ulceration -Dressed daily with antibiotic ointment and Band-Aid. Would benefit from surgical shoe to prevent further injury of this area.  Patient has declined this.  Return in about 2 weeks (around 05/22/2020) for MRI review.

## 2020-05-18 NOTE — Telephone Encounter (Signed)
I called pt this morning to inform him that his MRI of his right foot wo contrast was entered in our system. I also reminded the patient that he had an appointment schedule on May 29, 2020 in our [place] location at 845am.

## 2020-05-20 ENCOUNTER — Ambulatory Visit
Admission: RE | Admit: 2020-05-20 | Discharge: 2020-05-20 | Disposition: A | Discharge status: Home / Self Care | Payer: BC Managed Care – PPO | Source: Ambulatory Visit | Attending: Podiatry | Admitting: Podiatry

## 2020-05-20 ENCOUNTER — Other Ambulatory Visit: Payer: Self-pay

## 2020-05-20 DIAGNOSIS — S92901A Unspecified fracture of right foot, initial encounter for closed fracture: Secondary | ICD-10-CM

## 2020-05-20 IMAGING — MR MR FOOT*R* W/O CM
5 series · 40 of 40 positions shown · non-contrast
Comparison: None.

CLINICAL DATA: Diabetes. Infection in the foot, query Charcot foot.

EXAM:
MRI OF THE RIGHT FOREFOOT WITHOUT CONTRAST
TECHNIQUE: Multiplanar, multisequence MR imaging of the right forefoot was
performed. No intravenous contrast was administered.

[Series 4: T1 · coronal · right · 3.0mm · 0.38mm/px · 11 of 48 slices shown (1 of 2)]
[im 1/48]
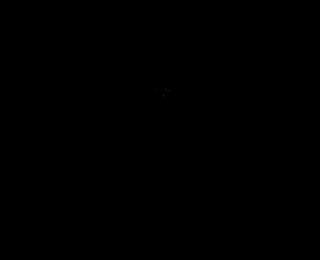
[im 5/48]
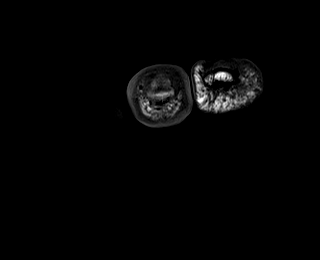
[im 10/48]
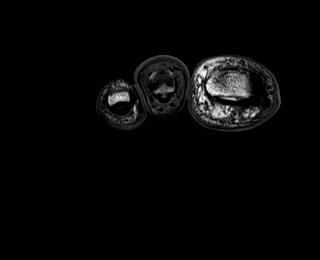
[im 15/48]
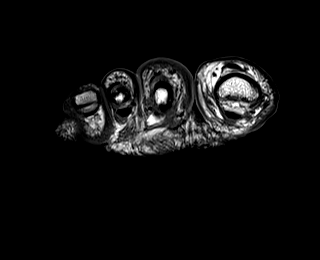
[im 19/48]
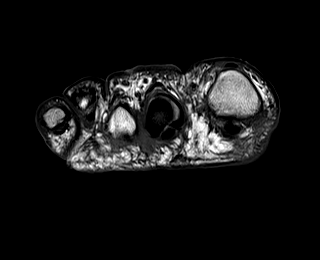
[im 24/48]
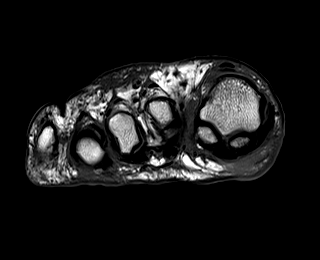
[im 29/48]
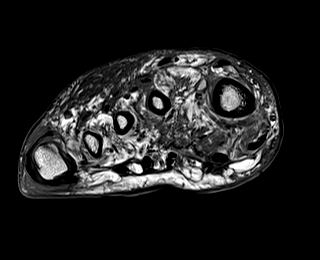
[im 33/48]
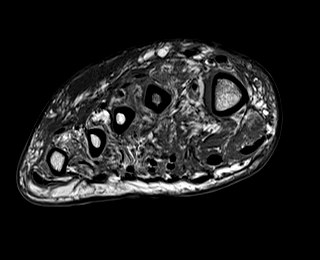
[im 38/48]
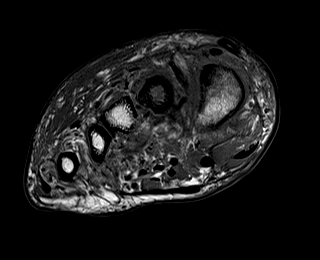
[im 43/48]
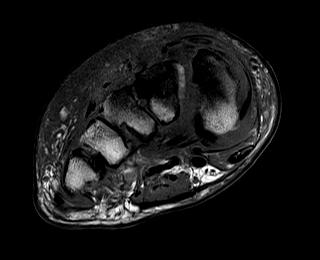
[im 48/48]
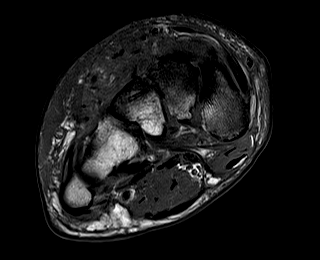

[Series 5: T2 fat-sat · coronal · right · 3.0mm · 0.31mm/px · 11 of 48 slices shown (1 of 2)]
[im 1/48]
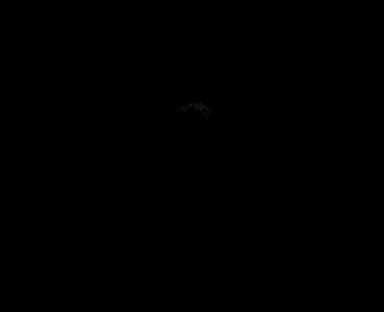
[im 5/48]
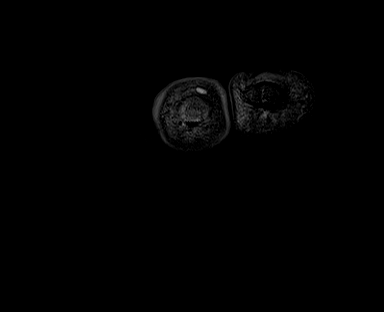
[im 10/48]
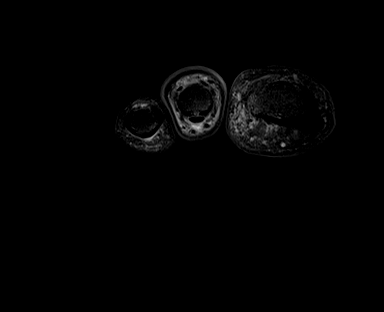
[im 15/48]
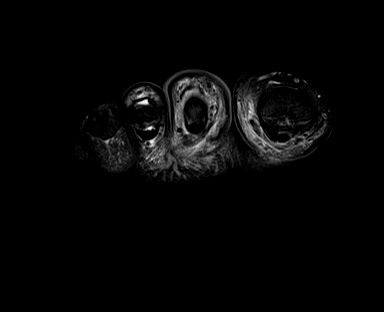
[im 19/48]
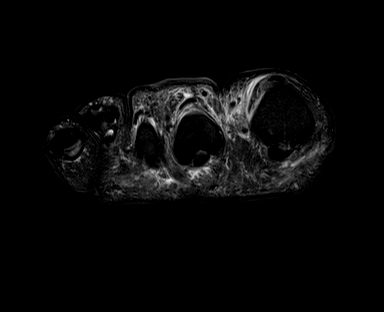
[im 24/48]
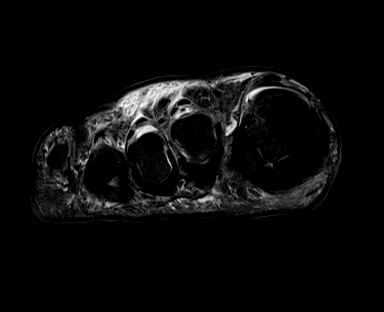
[im 29/48]
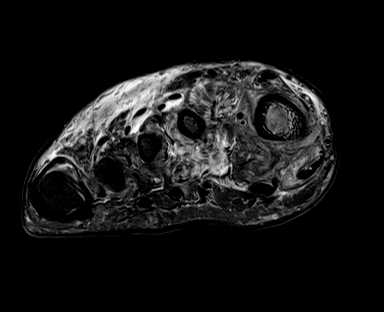
[im 33/48]
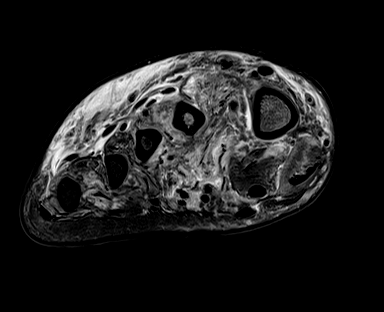
[im 38/48]
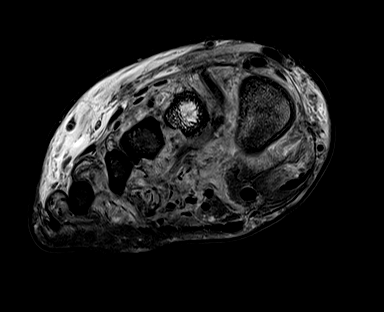
[im 43/48]
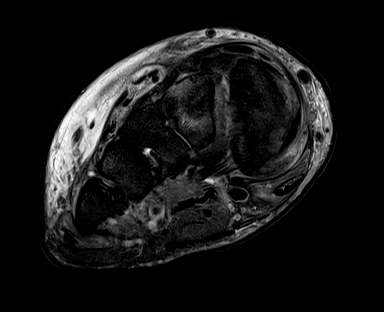
[im 48/48]
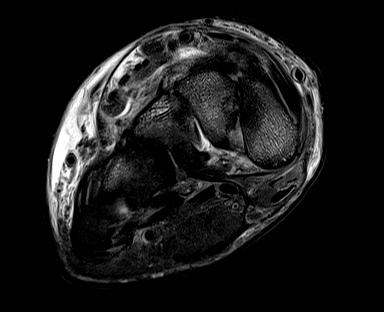

[Series 7: T1 · axial · right · 3.0mm · 0.59mm/px · z∈[-92,-3]mm · 5 of 24 slices shown (2 of 2)]
[im 1/24]
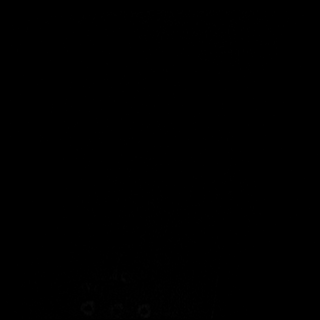
[im 6/24]
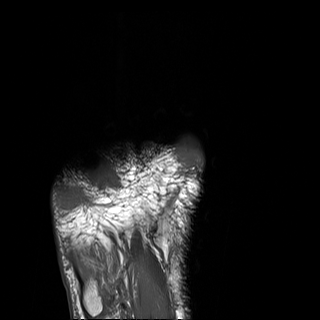
[im 12/24]
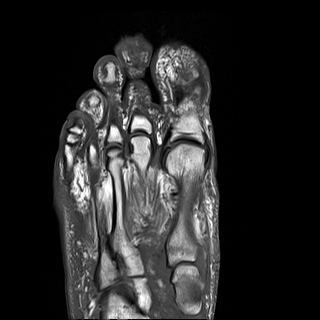
[im 18/24]
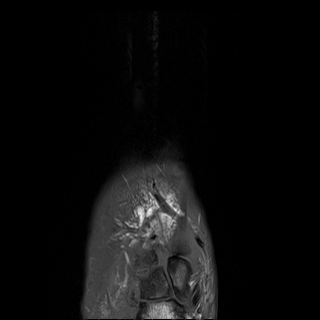
[im 24/24]
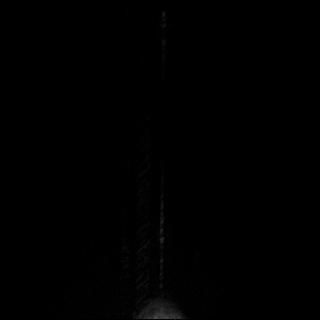

[Series 8: T2 fat-sat · axial · right · 3.0mm · 0.49mm/px · z∈[-90,-4]mm · 5 of 23 slices shown (2 of 2)]
[im 1/23]
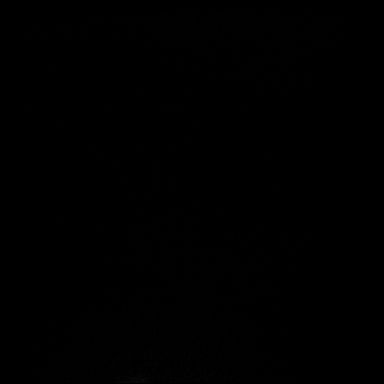
[im 6/23]
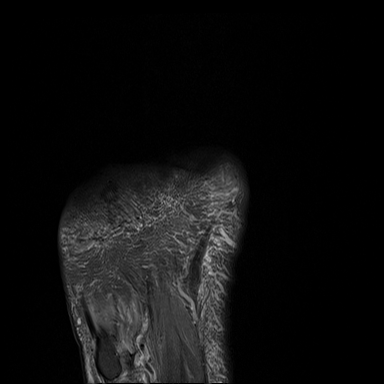
[im 12/23]
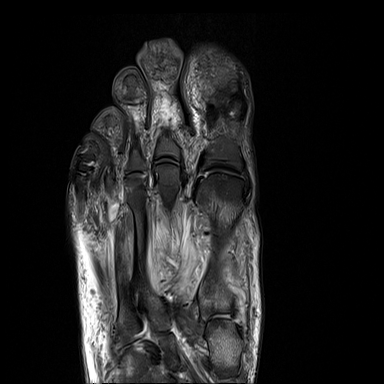
[im 17/23]
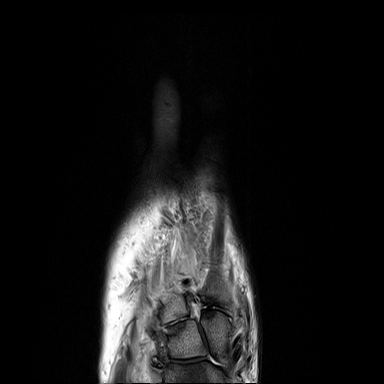
[im 23/23]
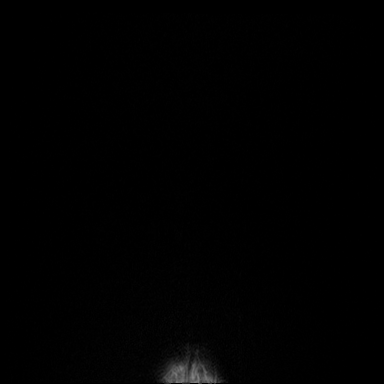

[Series 9: STIR · sagittal · right · 3.0mm · 0.70mm/px · 8 of 33 slices shown]
[im 1/33]
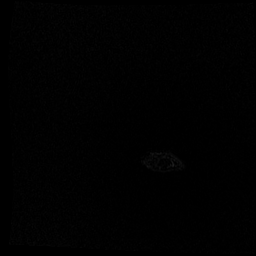
[im 5/33]
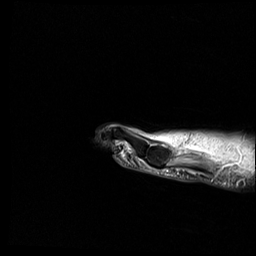
[im 10/33]
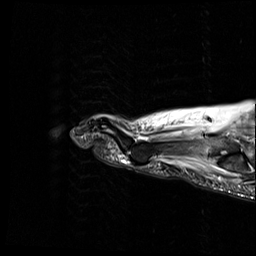
[im 14/33]
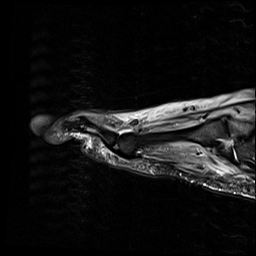
[im 19/33]
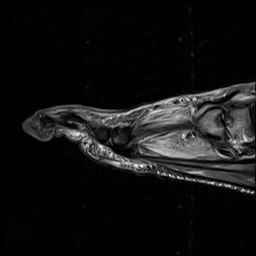
[im 23/33]
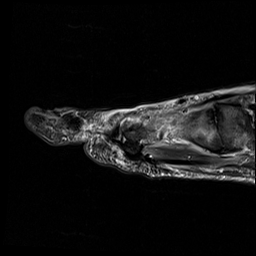
[im 28/33]
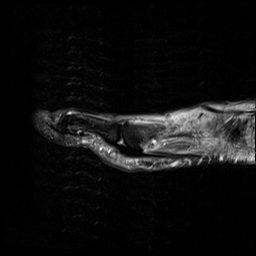
[im 33/33]
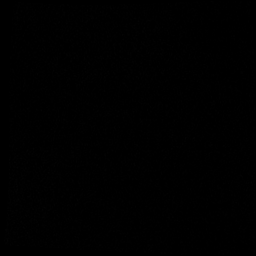

[40 of 40 positions shown; findings below may reference images not displayed]

FINDINGS: Bones/Joint/Cartilage

Marrow edema and heterogeneity is specially in the cuneiform is and
bases of the first and second metatarsals, and to a lesser extent
along the articulation of the navicular with the cuneiform spur and
distal cuboid. There are subcortical lesions between the medial
cuneiform and base of the first metatarsal, and between the middle
cuneiform and the base of the second metatarsal, shown on image 8
series 8. The second metatarsal appears laterally subluxed by about
0.3 cm with respect to the middle cuneiform, possibly accommodated
by insufficiency of the Lisfranc ligament which appears wispy and
indistinct.

There is abnormal accentuated T2 signal in the distal phalanx of the
second toe on image 17 series 9.

Ligaments

The Lisfranc ligament is indistinct and possibly torn.  A

Muscles and Tendons

Low-grade muscular edema along the regional musculature is
nonspecific but may reflect neurogenic edema.

Soft tissues

Subcutaneous edema especially tracking along the dorsum of the foot,
and to a lesser extent along the ball of the foot medially plantar
to the sesamoids. I do not observe well-defined ulceration. Some of
the subcutaneous edema extends into the toes, especially the first
and second toe. Questionable cutaneous defect along the tip of the
second toe on image 3 series 4.
IMPRESSION: 1. Complex appearance with extensive arthropathy and edema in
osseous structures along the Lisfranc joint especially medially, and
possible insufficiency of the Lisfranc ligament which appears wispy
and indistinct. There 3 mm of lateral subluxation of the second
metatarsal with respect to the middle cuneiform, and Lisfranc joint
instability is not excluded. However, the severe arthropathy in this
vicinity could alternatively be explained by early Charcot joint. I
am skeptical of this midfoot and Lisfranc process representing
infection.
2. However, there is some edema signal throughout the marrow of the
distal phalanx second toe with swelling of the second toe and
questionable cutaneous defect along the plantar tip of the second
toe, such that osteomyelitis of the distal phalanx is not excluded.
3. Striking subcutaneous edema along the dorsum of the foot,
nonspecific but cellulitis is not excluded.
4. Edema in the foot musculature, probably neurogenic in this
setting.

## 2020-05-29 ENCOUNTER — Other Ambulatory Visit: Payer: Self-pay

## 2020-05-29 ENCOUNTER — Ambulatory Visit: Payer: BC Managed Care – PPO | Admitting: Podiatry

## 2020-05-29 DIAGNOSIS — S92901A Unspecified fracture of right foot, initial encounter for closed fracture: Secondary | ICD-10-CM | POA: Diagnosis not present

## 2020-05-29 DIAGNOSIS — L97514 Non-pressure chronic ulcer of other part of right foot with necrosis of bone: Secondary | ICD-10-CM

## 2020-05-29 MED ORDER — CEPHALEXIN 500 MG PO CAPS
500.0000 mg | ORAL_CAPSULE | Freq: Three times a day (TID) | ORAL | 0 refills | Status: DC
Start: 2020-05-29 — End: 2020-06-07

## 2020-05-29 NOTE — Progress Notes (Signed)
  Subjective:  Patient ID: Paul Braun, male    DOB: 1960-07-03,  MRN: [id number]  Chief Complaint  Patient presents with  . charcots    Charcots foot , patient wants surgery toe is purple / red   60 y.o. male presents with the above complaint. History confirmed with patient.  States he had to go to the emergency room about a week after his visit where he had an infection in his toe and needed antibiotics.  States that the toe is looking better than it was when he went to the emergency room but is still swollen.  His midfoot is still painful he would like to talk about scheduling surgery  Objective:  Physical Exam: warm, good capillary refill, no trophic changes or ulcerative lesions, normal DP and PT pulses and normal sensory exam. Right Foot:  POP dorsal midfoot with mild deformity.  Local edema.  No warmth or erythema. Ulceration to the right second toe measuring 0.2 x 0.4 without active erythema but with new chronic edema of the digit  Assessment:   1. Closed fracture of right foot, initial encounter   2. Ulcer of great toe, right, with necrosis of bone ([place])   3. 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  Right foot Lisfranc fracture with previous diagnosis of Charcot however this diagnosis is unclear -We reviewed MRI.  I do not think he has had a history of Charcot.  He would benefit from delayed fusion however we did discuss that I am concerned about his sugars.  Ordered blood work today including CBC, CRP, ESR and hemoglobin A1c.  Should his A1c improved adequate for surgery we can consider reconstructive surgery for his Lisfranc fracture.  Of course he would also need to have at least a bone infection at this area so we will continue to monitor and evaluate blood work for signs of infection.  He may benefit from second toe debridement with possible partial bone resection prior to definitive  surgery  Right 2nd toe ulceration -I discussed with him that at last visit he did not have signs of infection of the toe warranting antibiotics.  It does appear that after his visit he developed an infection requiring antibiotics.  Recent MRI he likely does have osteomyelitis of the distal left residual we will continue to monitor.  Refill antibiotics for patient. -Patient has not worn surgical shoe.  We did discuss the reason for wearing the shoe close to prevent pressure formation that worsens ulceration and can lead to infection. He did request a shoe today after discussion of the importance of wearing shoe on we did dispense one today  Return in about 2 weeks (around 06/12/2020).

## 2020-06-04 ENCOUNTER — Telehealth: Payer: Self-pay | Admitting: Podiatry

## 2020-06-04 NOTE — Telephone Encounter (Signed)
Patient called and wanted to know if you have received the results from his A1C test. He said that wearing the boot, hurts his foot even more and needs to have the surgery asap. He would like to receive a callback to tell him if the test results were positive to proceed with surgery.

## 2020-06-06 ENCOUNTER — Encounter: Payer: Self-pay | Admitting: Podiatry

## 2020-06-06 ENCOUNTER — Telehealth: Payer: Self-pay | Admitting: Podiatry

## 2020-06-06 NOTE — Telephone Encounter (Signed)
The following patient called inquiring about lab results. Patient stated he was told he needed to have A1C checked before the patient could be scheduled for surgery. Patient labs were ordered for [facility], however patient went into [facility] in [place] and we don't have the results back yet. Patient has expressed concerns regarding infected area and stated he needs to hurry before he becomes at risk for amputation. Patient is currently experiencing redness, swelling and infection. Patient recently went into ER to be seen due to numbness and infected area. The ER placed patient on antibiotics but patient thinks the medication isnt working poperly to help fight off infection. I tried to contact [facility] to gather more information for the patient but was unable to get anyone on the phone. Patient has requested return call regarding matter and stated he has called numerous times to solve matter but has yet to receive response, Please Advise  I attached Barbaraann Rondo and Cannon Kettle due to Price being in surgery and patient has expressed concerns of urgency due to being diabetic

## 2020-06-06 NOTE — Telephone Encounter (Signed)
Results scanned in

## 2020-06-06 NOTE — Telephone Encounter (Signed)
Dr. March Rummage please advicce

## 2020-06-07 ENCOUNTER — Other Ambulatory Visit: Payer: Self-pay | Admitting: Podiatry

## 2020-06-07 MED ORDER — CEPHALEXIN 500 MG PO CAPS: 500.0000 mg | ORAL_CAPSULE | Freq: Three times a day (TID) | ORAL | 0 refills | Status: DC

## 2020-06-07 NOTE — Progress Notes (Signed)
Rx sent to pharmacy   

## 2020-06-08 NOTE — Telephone Encounter (Signed)
He had the required labs done they're scanned into his chart

## 2020-06-12 ENCOUNTER — Ambulatory Visit: Payer: BC Managed Care – PPO | Admitting: Podiatry

## 2020-06-12 ENCOUNTER — Other Ambulatory Visit: Payer: Self-pay | Admitting: Podiatry

## 2020-06-12 ENCOUNTER — Other Ambulatory Visit: Payer: Self-pay

## 2020-06-12 DIAGNOSIS — S92901A Unspecified fracture of right foot, initial encounter for closed fracture: Secondary | ICD-10-CM | POA: Diagnosis not present

## 2020-06-12 DIAGNOSIS — L97514 Non-pressure chronic ulcer of other part of right foot with necrosis of bone: Secondary | ICD-10-CM | POA: Diagnosis not present

## 2020-06-12 MED ORDER — CEPHALEXIN 500 MG PO CAPS: 500.0000 mg | ORAL_CAPSULE | Freq: Three times a day (TID) | ORAL | 0 refills | Status: DC

## 2020-06-12 NOTE — Progress Notes (Signed)
  Subjective:  Patient ID: Paul Braun, male    DOB: December 17, 1960,  MRN: [id number]  Chief Complaint  Patient presents with  . Follow-up    Reports tip of 2nd right toe ulcer still has not healed and pain is still excruciating on bottom of foot as well. Is ready to discuss surgical intervention.    60 y.o. male presents with the above complaint. History confirmed with patient. States the toe is doing worse and he is still concerned about the pain in his midfoot.  Objective:  Physical Exam: warm, good capillary refill, no trophic changes or ulcerative lesions, normal DP and PT pulses and normal sensory exam. Right Foot:  POP dorsal midfoot with mild deformity.  Local edema.  No warmth or erythema. Ulceration to the right second toe with serous drainage, measuring 0.2 x 0.4 with redness and edema distally   Assessment:   1. 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 Ulcer of great toe, right, with necrosis of bone ([facility])   3. Closed fracture of right foot, initial encounter    Plan:  Patient was evaluated and treated and all questions answered.  Right foot Lisfranc fracture with previous diagnosis of Charcot however this diagnosis is unclear -His A1c does appear improved and would be amenable to surgery. However I discussed this is not advisable -I do empathize with the patient that he is experiencing pain and I would like to be able to help him however he repeatedly does not seem to understand the severity of his medical issues despite repeated explanation. -We did go over consent forms for his surgery however we cannot proceed with surgery until either his 2nd toe infection is resolved. Patient is not happy about this. I tried to explain that making a new incision with an active infection can cause infection to spread and that we cannot proceed at this time but I hope we could proceed in about a month.  Right 2nd toe ulceration -Previous labs reviewed. -Still edematous  with distal erythema. I am concerned about osteomyelitis. -Silvadene and band-aid applied to the sore. -Patient must use the surgical shoe to prevent further issues. I again discussed that this will not heal without offloading and his shoes are not helping. -I advised that he may need amputation of the end of the digit. Patient not interested in this and continues to believe that antibiotics will resolve this alone. -Continue abx  Return in about 2 weeks (around 06/26/2020) for toe ulcer check up.

## 2020-06-14 ENCOUNTER — Telehealth: Payer: Self-pay | Admitting: Podiatry

## 2020-06-14 NOTE — Telephone Encounter (Signed)
Patient called and lvm stating that he needed to know what was found on the culture for the tip of his toe so he can get it to heal. The dead skin needs to shaved, and nobody has done it yet. He needs to know what to do. He does not want to loose his toe. Please call soon.

## 2020-06-14 NOTE — Telephone Encounter (Signed)
Please inform results are still pending

## 2020-06-15 ENCOUNTER — Ambulatory Visit: Payer: BC Managed Care – PPO | Admitting: Podiatry

## 2020-06-16 LAB — WOUND CULTURE
MICRO NUMBER:: 11871922
SPECIMEN QUALITY:: ADEQUATE

## 2020-06-16 LAB — HOUSE ACCOUNT TRACKING

## 2020-06-19 ENCOUNTER — Other Ambulatory Visit: Payer: Self-pay | Admitting: Podiatry

## 2020-06-19 MED ORDER — LEVOFLOXACIN 250 MG PO TABS: 250.0000 mg | ORAL_TABLET | Freq: Every day | ORAL | 0 refills | Status: DC

## 2020-06-19 NOTE — Progress Notes (Signed)
New antibiotics sent to pharmacy. Pt culture grew Klebsiella

## 2020-06-27 ENCOUNTER — Telehealth: Payer: Self-pay | Admitting: Urology

## 2020-06-27 NOTE — Telephone Encounter (Signed)
DOS - 07/18/20  ARTHRODESIS 2,3 AND POSS 1ST RIGHT --- 28730   [employer] EFFECTIVE DATE - 02/04/20   PLAN DEDUCTIBLE - $0.00 W/ $0.00 REMAINING OUT OF POCKET - $650.00 W/ $0.00 REMAINING COINSURANCE - 50% COPAY - $0.00    NO PRIOR AUTH REQUIRED

## 2020-06-29 ENCOUNTER — Ambulatory Visit (INDEPENDENT_AMBULATORY_CARE_PROVIDER_SITE_OTHER): Payer: BC Managed Care – PPO | Admitting: Podiatry

## 2020-06-29 ENCOUNTER — Other Ambulatory Visit: Payer: Self-pay

## 2020-06-29 DIAGNOSIS — L97514 Non-pressure chronic ulcer of other part of right foot with necrosis of bone: Secondary | ICD-10-CM

## 2020-06-29 MED ORDER — LEVOFLOXACIN 250 MG PO TABS: 250.0000 mg | ORAL_TABLET | Freq: Every day | ORAL | 0 refills | Status: AC

## 2020-06-29 NOTE — Progress Notes (Signed)
  Subjective:  Patient ID: Paul Braun, male    DOB: 03-15-1960,  MRN: [id number]  Chief Complaint  Patient presents with  . Wound Check    Denies f/c/n/v. Reports improvement. Has additional questions about surgery and "rolling thing."    60 y.o. male presents with the above complaint. History confirmed with patient. Has been using skin wipe pads on his toe. Cut hard skin off of his toe and thinks it is doing a little better.  Objective:  Physical Exam: warm, good capillary refill, no trophic changes or ulcerative lesions, normal DP and PT pulses and normal sensory exam. Right Foot:  POP dorsal midfoot with mild deformity.  Local edema.  No warmth or erythema. Ulceration to the right second toe with serous drainage, measuring 0.2 x 0.2 without active redness and edema distally   Assessment:   1. 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 Ulcer of great toe, right, with necrosis of bone ([place])    Plan:  Patient was evaluated and treated and all questions answered.  Right foot Lisfranc fracture with previous diagnosis of Charcot however this diagnosis is unclear -Pending surgery but need 2nd toe ulcer to be healed   Right 2nd toe ulceration -Improving somewhat -Refill abx. -Advised patient against using the skin prep wipes he has been using on his toe. Patient states he will continue to use them because they are helping. -Continue surgical shoe -Debrided today and dressed with silvadene and band-aid.  Return in about 2 weeks (around 07/13/2020) for ulcer check.

## 2020-07-13 ENCOUNTER — Other Ambulatory Visit: Payer: Self-pay

## 2020-07-13 ENCOUNTER — Ambulatory Visit (INDEPENDENT_AMBULATORY_CARE_PROVIDER_SITE_OTHER): Payer: BC Managed Care – PPO | Admitting: Podiatry

## 2020-07-13 ENCOUNTER — Ambulatory Visit (INDEPENDENT_AMBULATORY_CARE_PROVIDER_SITE_OTHER): Payer: BC Managed Care – PPO

## 2020-07-13 DIAGNOSIS — M86171 Other acute osteomyelitis, right ankle and foot: Secondary | ICD-10-CM | POA: Diagnosis not present

## 2020-07-13 DIAGNOSIS — S92901A Unspecified fracture of right foot, initial encounter for closed fracture: Secondary | ICD-10-CM

## 2020-07-13 DIAGNOSIS — L97514 Non-pressure chronic ulcer of other part of right foot with necrosis of bone: Secondary | ICD-10-CM

## 2020-07-14 ENCOUNTER — Ambulatory Visit
Admission: RE | Admit: 2020-07-14 | Discharge: 2020-07-14 | Disposition: A | Discharge status: Home / Self Care | Payer: BC Managed Care – PPO | Source: Ambulatory Visit | Attending: Podiatry | Admitting: Podiatry

## 2020-07-14 DIAGNOSIS — M86171 Other acute osteomyelitis, right ankle and foot: Secondary | ICD-10-CM

## 2020-07-14 LAB — CBC WITH DIFFERENTIAL/PLATELET
Absolute Monocytes: 382 cells/uL (ref 200–950)
Basophils Absolute: 48 cells/uL (ref 0–200)
Basophils Relative: 0.9 %
Eosinophils Absolute: 212 cells/uL (ref 15–500)
Eosinophils Relative: 4 %
HCT: 43.5 % (ref 38.5–50.0)
Hemoglobin: 14.6 g/dL (ref 13.2–17.1)
Lymphs Abs: 1246 cells/uL (ref 850–3900)
MCH: 29.7 pg (ref 27.0–33.0)
MCHC: 33.6 g/dL (ref 32.0–36.0)
MCV: 88.6 fL (ref 80.0–100.0)
MPV: 8.7 fL (ref 7.5–12.5)
Monocytes Relative: 7.2 %
Neutro Abs: 3413 cells/uL (ref 1500–7800)
Neutrophils Relative %: 64.4 %
Platelets: 287 10*3/uL (ref 140–400)
RBC: 4.91 10*6/uL (ref 4.20–5.80)
RDW: 12.9 % (ref 11.0–15.0)
Total Lymphocyte: 23.5 %
WBC: 5.3 10*3/uL (ref 3.8–10.8)

## 2020-07-14 LAB — BASIC METABOLIC PANEL
BUN: 19 mg/dL (ref 7–25)
CO2: 22 mmol/L (ref 20–32)
Calcium: 9.6 mg/dL (ref 8.6–10.3)
Chloride: 103 mmol/L (ref 98–110)
Creat: 0.86 mg/dL (ref 0.70–1.25)
Glucose, Bld: 202 mg/dL — ABNORMAL HIGH (ref 65–99)
Potassium: 4.5 mmol/L (ref 3.5–5.3)
Sodium: 134 mmol/L — ABNORMAL LOW (ref 135–146)

## 2020-07-14 LAB — C-REACTIVE PROTEIN: CRP: 1.5 mg/L (ref <8.0)

## 2020-07-14 LAB — SEDIMENTATION RATE: Sed Rate: 6 mm/h (ref 0–20)

## 2020-07-14 IMAGING — MR MR FOOT*R* W/O CM
5 of 7 series · 25 of 40 positions shown · non-contrast
Comparison: Prior MRI [DATE]

CLINICAL DATA: Nonhealing wound.

EXAM:
MRI OF THE RIGHT FOREFOOT WITHOUT CONTRAST
TECHNIQUE: Multiplanar, multisequence MR imaging of the right foot was
performed. No intravenous contrast was administered.

[Series 4: T1 · coronal · 4.0mm · 0.39mm/px · 2 of 30 slices shown]
[im 1/30]
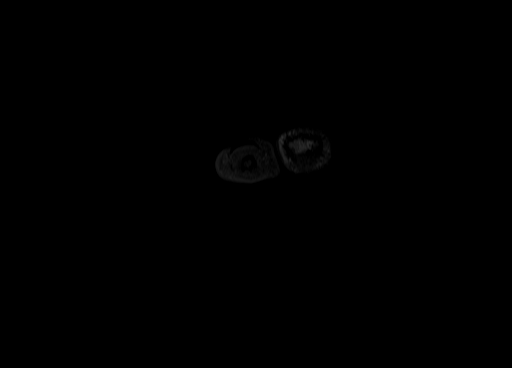
[im 6/30]
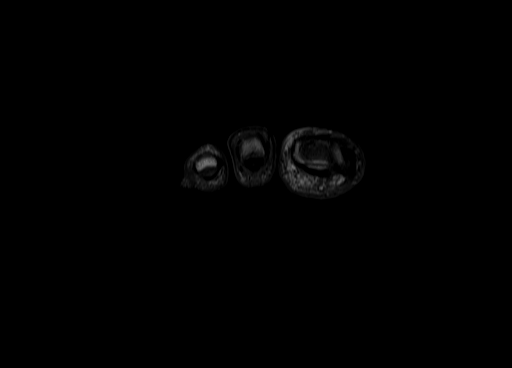

[Series 5: T2 fat-sat · coronal · 4.0mm · 0.39mm/px · 7 of 30 slices shown (1 of 3)]
[im 1/30]
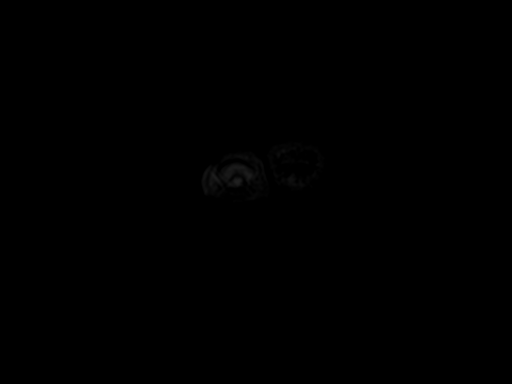
[im 5/30]
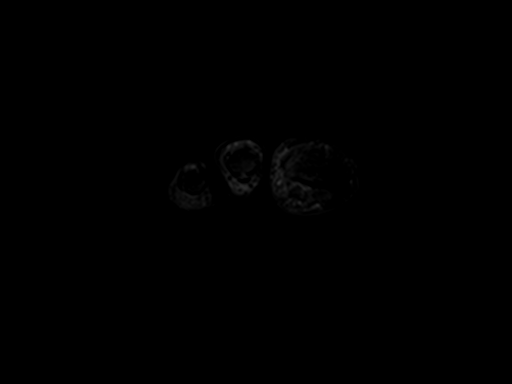
[im 10/30]
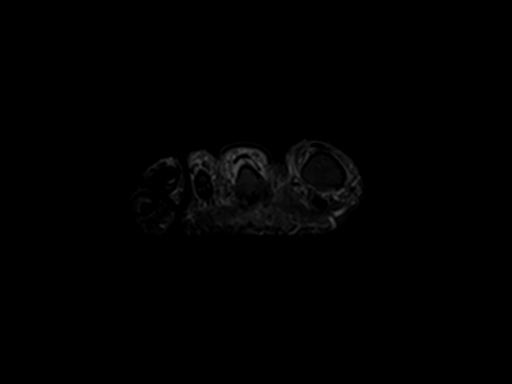
[im 15/30]
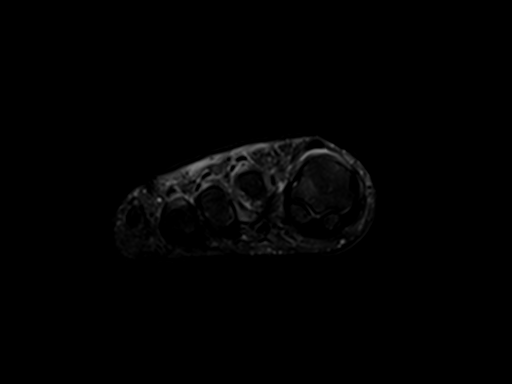
[im 20/30]
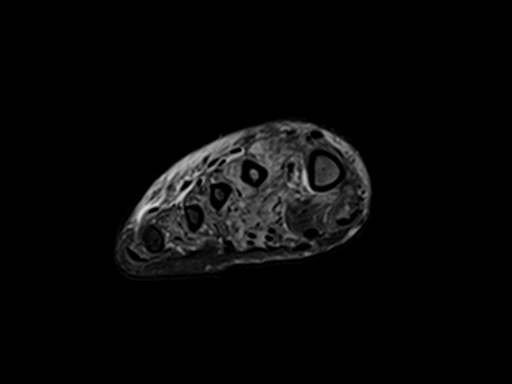
[im 25/30]
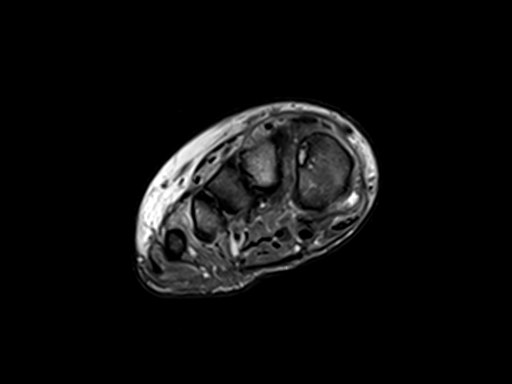
[im 30/30]
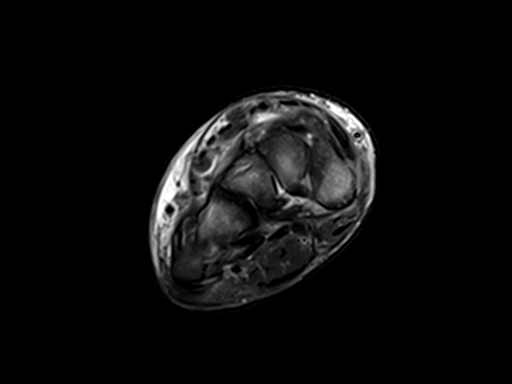

[Series 7: T2 fat-sat · axial · 4.0mm · 0.39mm/px · z∈[-111,-18]mm · 4 of 20 slices shown (2 of 3)]
[im 1/20]
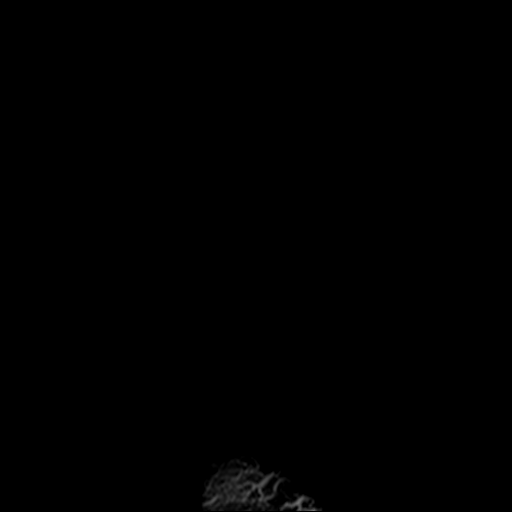
[im 7/20]
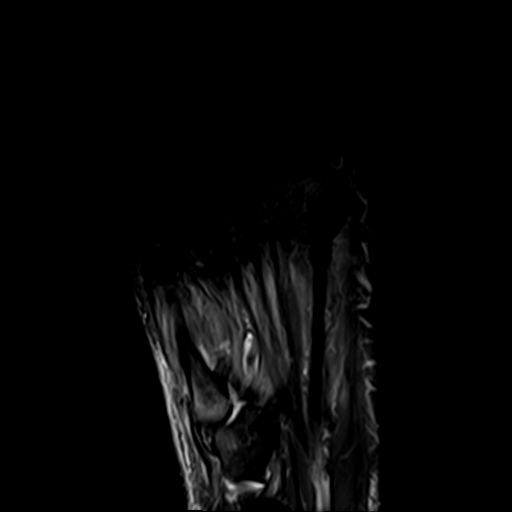
[im 13/20]
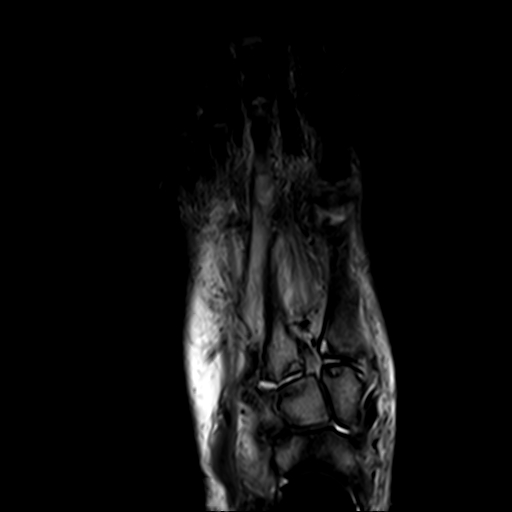
[im 20/20]
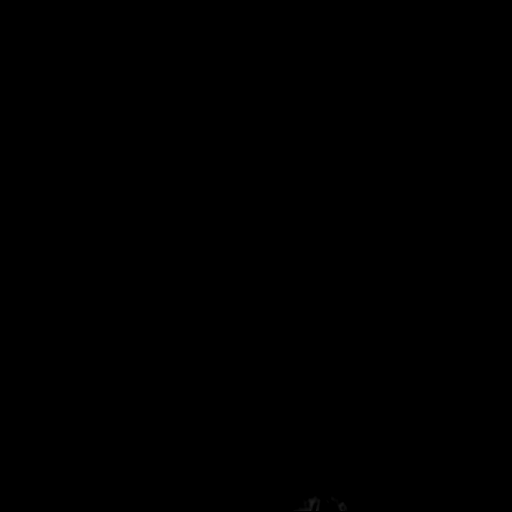

[Series 8: PD fat-sat · sagittal · 4.0mm · 0.43mm/px · 5 of 21 slices shown]
[im 1/21]
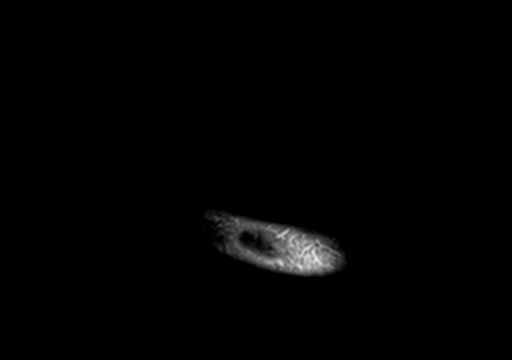
[im 6/21]
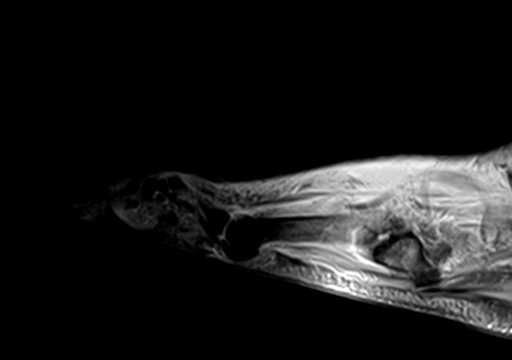
[im 11/21]
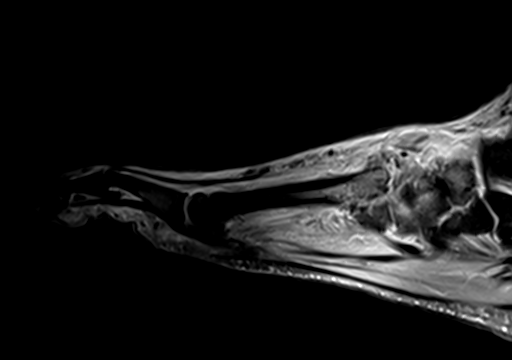
[im 16/21]
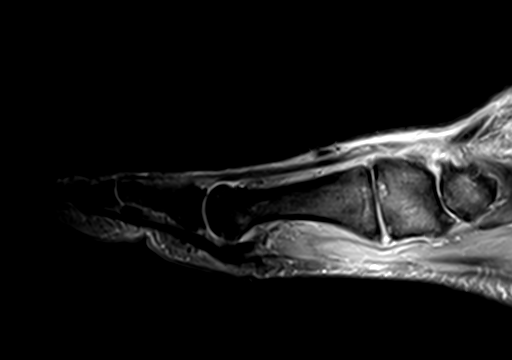
[im 21/21]
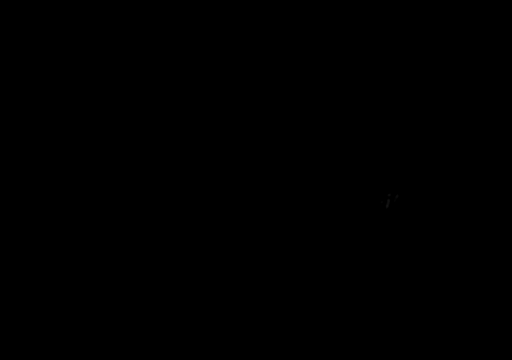

[Series 9: T2 fat-sat · coronal · 4.0mm · 0.39mm/px · 7 of 30 slices shown (3 of 3)]
[im 1/30]
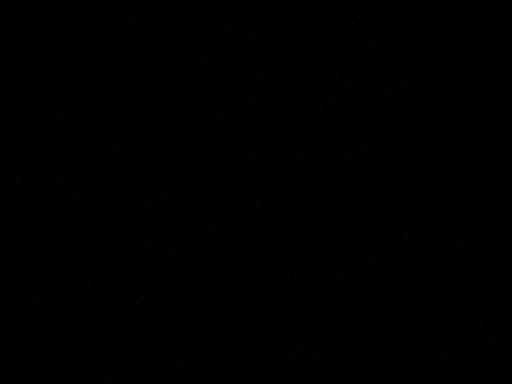
[im 5/30]
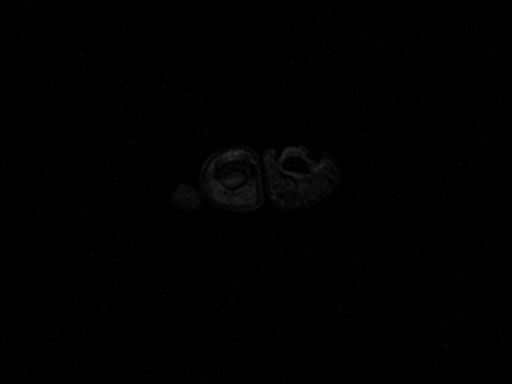
[im 10/30]
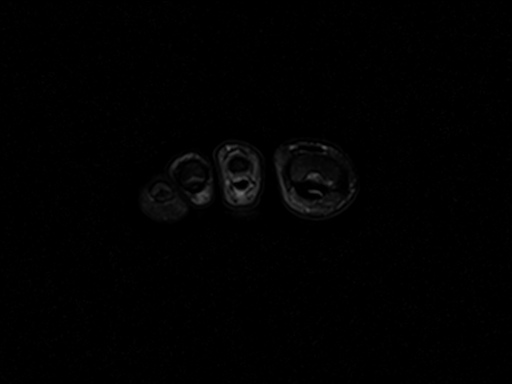
[im 15/30]
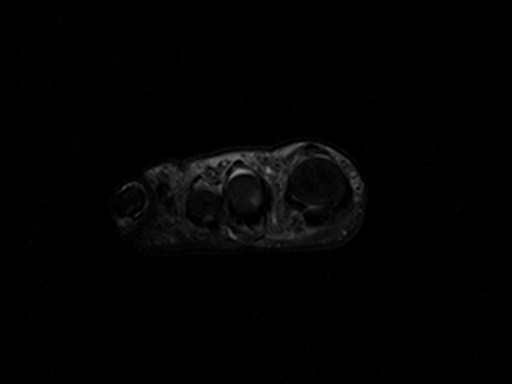
[im 20/30]
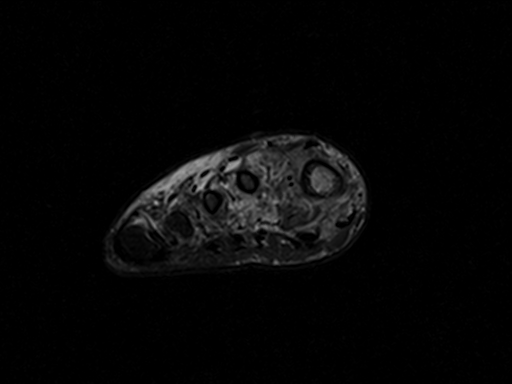
[im 25/30]
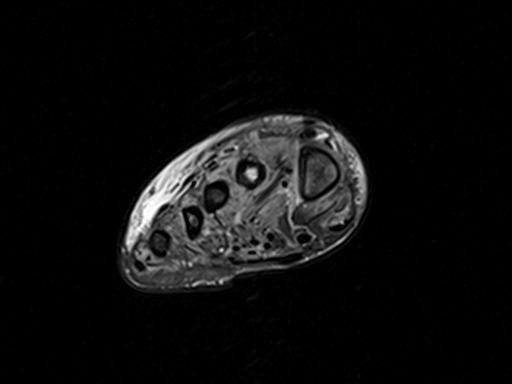
[im 30/30]
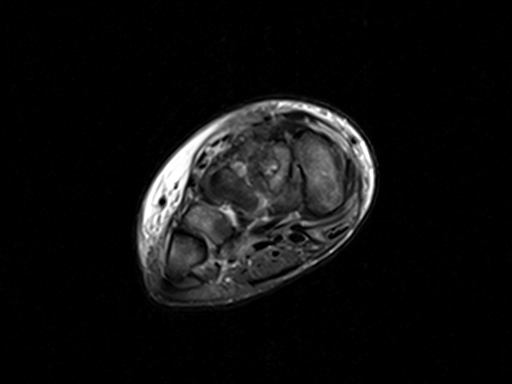

[25 of 40 positions shown; findings below may reference images not displayed]

FINDINGS: Overall stable appearance of the midfoot process compared to the
prior MRI. There are significant degenerative changes at the tarsal
metatarsal joints with moderate marrow edema, subchondral cystic
change, joint space narrowing and small effusions. Stable lateral
subluxation of the second metatarsal base in relation to the first
metatarsal and the middle cuneiform likely due to disruption of the
Lisfranc ligament. I do not see any definite MR findings to suggest
septic arthritis or osteomyelitis.

Dorsal subcutaneous soft tissue swelling is again demonstrated
without discrete abscess. I do not see any significant findings for
myositis or pyomyositis.
IMPRESSION: 1. Overall stable appearance of the midfoot process compared to the
prior MRI.
2. No definite MR findings to suggest septic arthritis or
osteomyelitis.
3. Stable advanced degenerative changes at the tarsal metatarsal
joints as detailed above.
4. Dorsal subcutaneous soft tissue swelling without discrete
abscess.

## 2020-07-16 ENCOUNTER — Telehealth: Payer: Self-pay | Admitting: Podiatry

## 2020-07-16 NOTE — Telephone Encounter (Signed)
Patient called and stated that he got a mri done on Saturday and wanted to know if he can go ahead with sx.

## 2020-07-17 ENCOUNTER — Telehealth: Payer: Self-pay | Admitting: Podiatry

## 2020-07-17 NOTE — Telephone Encounter (Signed)
Pt would like for you to give him a call at your earliest convenience. He states he doesn't have an infection and would like to either have his surgery or be seen as soon as possible due to his foot deteriorating. Please advise.

## 2020-07-19 ENCOUNTER — Telehealth: Payer: Self-pay | Admitting: Podiatry

## 2020-07-19 NOTE — Telephone Encounter (Signed)
Patient came into office today stating that he was to have an appointment at 4pm with Dr. March Rummage. Dr. March Rummage is not in office today. Did advise patient that he is scheduled for 6/24. Patient was very upset and left office.

## 2020-07-23 NOTE — Telephone Encounter (Signed)
Let's discuss timing of scheduling his surgery tomorrow.

## 2020-07-24 ENCOUNTER — Encounter: Payer: BC Managed Care – PPO | Admitting: Podiatry

## 2020-07-25 ENCOUNTER — Telehealth: Payer: Self-pay

## 2020-07-25 NOTE — Telephone Encounter (Signed)
Pt called today extremely upset about his care.  Pt stated he was seen on 07/13/20 at that time pt stated that Dr. March Rummage cancelled his surgery that was schedule for 07/18/20 because he wanted the patient to get a MRI for possible infection of the foot. The MRI was completed on 07/14/20.The normal flow of the office is what once the results are back the provider reviews them and gets the patient schedule for an appointment. Unfortunately, Dr. March Rummage was out sick during June 13-17, 2022. The pt did call the office on 07/16/20 and stated that he got a MRI done on Saturday and wanted to know if he can go ahead with surgery. 07/17/20 pt called and wanted Dr. March Rummage to give him a call at his earliest convenience. Pt states he doesn't have an infection and would like to either have his surgery or be seen as soon as possible due to his foot deteriorating. Pt stated that he received a text message stating that he had an appointment surgery for 07/19/20, when reviewing the chart I unfortunately don't see anything scheduled within the system for an appointment and Dr. March Rummage is not location in the [place] office on Thursday's. Pt was upset because he came to the office and stated that he had an appointment at 4 pm with Dr. March Rummage. The front office staff informed patient that March Rummage was not in the office and advised the pt that he was scheduled for 07/27/20. The appointment for 07/27/20 was cancelled by [facility] because it was for his 1st POV.  07/23/20 Dr. March Rummage reply lets discuss timing of scheduling his surgery on Tuesday 07/24/20. Pt stated that he will not be back to our office and wanted me to cancelled his appointment that is schedule for Friday 07/27/20 at 830a. Pt stated that he will be going to the orthopedic instead.

## 2020-07-25 NOTE — Telephone Encounter (Signed)
Spoke to [place] about getting his surgery rescheduled with Dr. March Rummage. He stated he does not want to reschedule his surgery. He is going somewhere else. I notified Dr. March Rummage.

## 2020-07-25 NOTE — Telephone Encounter (Signed)
Noted thanks °

## 2020-07-27 ENCOUNTER — Ambulatory Visit: Payer: BC Managed Care – PPO | Admitting: Podiatry

## 2020-08-03 NOTE — Progress Notes (Signed)
  Subjective:  Patient ID: Paul Braun, male    DOB: 06-01-1960,  MRN: [id number]  Chief Complaint  Patient presents with   Foot Ulcer    Pt denies fever/chills/nausea/vomiting. No new concerns. Discuss surgery.   60 y.o. male presents with the above complaint. History confirmed with patient. Has been using skin wipe pads on his toe. Cut hard skin off of his toe and thinks it is doing a little better.  Objective:  Physical Exam: warm, good capillary refill, no trophic changes or ulcerative lesions, normal DP and PT pulses and normal sensory exam. Right Foot:  POP dorsal midfoot with mild deformity.  Local edema.  No warmth or erythema.  Second toe ulceration does appear to have healed yet with continued edema.  No active warmth. Assessment:   1. 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 Other acute osteomyelitis of right foot ([facility])   3. Closed fracture of right foot, initial encounter    Plan:  Patient was evaluated and treated and all questions answered.  Right foot Lisfranc fracture with previous diagnosis of Charcot however this diagnosis is unclear -Well resettle surgery for brief period to ensure no infection and then he can proceed  Right 2nd toe ulceration -Does continue to improve appears essentially healed.  Given history I would like to get an MRI prior to proceeding with surgery to rule out osteomyelitis of the 2nd toe.  We will also do blood work to evaluate for signs of infection.  If these are cleared we can proceed with surgery as planned.  I appreciate patient's frustration with delay to surgery however I explained that it is important to make sure that there is no infection that can be spread to the surgical site.  His fracture is over 32 years old and while he does have a painful arthritis that is a toe ulcer that is preventing surgery -Follow-up after MRI and we can plan for surgery  Return in about 2 weeks (around 07/27/2020) for MRI follow-up.

## 2020-08-07 ENCOUNTER — Encounter: Payer: BC Managed Care – PPO | Admitting: Podiatry

## 2020-08-21 ENCOUNTER — Emergency Department: Payer: BC Managed Care – PPO

## 2020-08-21 ENCOUNTER — Inpatient Hospital Stay
Admission: EM | Admit: 2020-08-21 | Discharge: 2020-09-03 | DRG: 617 | Disposition: A | Discharge status: Home / Self Care | Payer: BC Managed Care – PPO | Attending: Hospitalist | Admitting: Hospitalist

## 2020-08-21 DIAGNOSIS — E1142 Type 2 diabetes mellitus with diabetic polyneuropathy: Secondary | ICD-10-CM | POA: Diagnosis present

## 2020-08-21 DIAGNOSIS — L97519 Non-pressure chronic ulcer of other part of right foot with unspecified severity: Secondary | ICD-10-CM

## 2020-08-21 DIAGNOSIS — Z86011 Personal history of benign neoplasm of the brain: Secondary | ICD-10-CM

## 2020-08-21 DIAGNOSIS — L97513 Non-pressure chronic ulcer of other part of right foot with necrosis of muscle: Secondary | ICD-10-CM | POA: Diagnosis present

## 2020-08-21 DIAGNOSIS — L03115 Cellulitis of right lower limb: Secondary | ICD-10-CM | POA: Diagnosis present

## 2020-08-21 DIAGNOSIS — Z9119 Patient's noncompliance with other medical treatment and regimen: Secondary | ICD-10-CM

## 2020-08-21 DIAGNOSIS — E11610 Type 2 diabetes mellitus with diabetic neuropathic arthropathy: Secondary | ICD-10-CM | POA: Diagnosis present

## 2020-08-21 DIAGNOSIS — E871 Hypo-osmolality and hyponatremia: Secondary | ICD-10-CM | POA: Diagnosis present

## 2020-08-21 DIAGNOSIS — I10 Essential (primary) hypertension: Secondary | ICD-10-CM | POA: Diagnosis present

## 2020-08-21 DIAGNOSIS — M86171 Other acute osteomyelitis, right ankle and foot: Secondary | ICD-10-CM | POA: Diagnosis present

## 2020-08-21 DIAGNOSIS — E1169 Type 2 diabetes mellitus with other specified complication: Principal | ICD-10-CM | POA: Diagnosis present

## 2020-08-21 DIAGNOSIS — F22 Delusional disorders: Secondary | ICD-10-CM | POA: Diagnosis present

## 2020-08-21 DIAGNOSIS — F319 Bipolar disorder, unspecified: Secondary | ICD-10-CM | POA: Diagnosis present

## 2020-08-21 DIAGNOSIS — M869 Osteomyelitis, unspecified: Secondary | ICD-10-CM

## 2020-08-21 DIAGNOSIS — L03116 Cellulitis of left lower limb: Secondary | ICD-10-CM | POA: Diagnosis present

## 2020-08-21 DIAGNOSIS — E7800 Pure hypercholesterolemia, unspecified: Secondary | ICD-10-CM | POA: Diagnosis present

## 2020-08-21 DIAGNOSIS — B952 Enterococcus as the cause of diseases classified elsewhere: Secondary | ICD-10-CM | POA: Diagnosis present

## 2020-08-21 DIAGNOSIS — Z7984 Long term (current) use of oral hypoglycemic drugs: Secondary | ICD-10-CM

## 2020-08-21 DIAGNOSIS — Z833 Family history of diabetes mellitus: Secondary | ICD-10-CM

## 2020-08-21 DIAGNOSIS — E11621 Type 2 diabetes mellitus with foot ulcer: Secondary | ICD-10-CM | POA: Diagnosis present

## 2020-08-21 DIAGNOSIS — E1165 Type 2 diabetes mellitus with hyperglycemia: Secondary | ICD-10-CM | POA: Diagnosis present

## 2020-08-21 LAB — CBC AND DIFFERENTIAL
Absolute NRBC: 0 10*3/uL (ref 0.00–0.00)
Basophils Absolute Automated: 0.04 10*3/uL (ref 0.00–0.08)
Basophils Automated: 0.8 %
Eosinophils Absolute Automated: 0.1 10*3/uL (ref 0.00–0.44)
Eosinophils Automated: 1.9 %
Hematocrit: 42.3 % (ref 37.6–49.6)
Hgb: 14.5 g/dL (ref 12.5–17.1)
Immature Granulocytes Absolute: 0.03 10*3/uL (ref 0.00–0.07)
Immature Granulocytes: 0.6 %
Lymphocytes Absolute Automated: 0.94 10*3/uL (ref 0.42–3.22)
Lymphocytes Automated: 18.1 %
MCH: 29.5 pg (ref 25.1–33.5)
MCHC: 34.3 g/dL (ref 31.5–35.8)
MCV: 86 fL (ref 78.0–96.0)
MPV: 8.7 fL — ABNORMAL LOW (ref 8.9–12.5)
Monocytes Absolute Automated: 0.71 10*3/uL (ref 0.21–0.85)
Monocytes: 13.7 %
Neutrophils Absolute: 3.38 10*3/uL (ref 1.10–6.33)
Neutrophils: 64.9 %
Nucleated RBC: 0 /100 WBC (ref 0.0–0.0)
Platelets: 236 10*3/uL (ref 142–346)
RBC: 4.92 10*6/uL (ref 4.20–5.90)
RDW: 12 % (ref 11–15)
WBC: 5.2 10*3/uL (ref 3.10–9.50)

## 2020-08-21 LAB — COMPREHENSIVE METABOLIC PANEL
ALT: 40 U/L (ref 0–55)
AST (SGOT): 25 U/L (ref 5–34)
Albumin/Globulin Ratio: 0.9 (ref 0.9–2.2)
Albumin: 3.5 g/dL (ref 3.5–5.0)
Alkaline Phosphatase: 99 U/L (ref 38–106)
Anion Gap: 11 (ref 5.0–15.0)
BUN: 21 mg/dL (ref 9–28)
Bilirubin, Total: 0.4 mg/dL (ref 0.2–1.2)
CO2: 25 mEq/L (ref 22–29)
Calcium: 9.9 mg/dL (ref 8.5–10.5)
Chloride: 100 mEq/L (ref 100–111)
Creatinine: 1.1 mg/dL (ref 0.7–1.3)
Globulin: 3.7 g/dL — ABNORMAL HIGH (ref 2.0–3.6)
Glucose: 301 mg/dL — ABNORMAL HIGH (ref 70–100)
Potassium: 4 mEq/L (ref 3.5–5.1)
Protein, Total: 7.2 g/dL (ref 6.0–8.3)
Sodium: 136 mEq/L (ref 136–145)

## 2020-08-21 LAB — GFR: EGFR: >60

## 2020-08-21 LAB — GLUCOSE WHOLE BLOOD - POCT: Whole Blood Glucose POCT: 342 mg/dL — ABNORMAL HIGH (ref 70–100)

## 2020-08-21 LAB — SEDIMENTATION RATE: Sed Rate: 38 mm/Hr — ABNORMAL HIGH (ref 0–15)

## 2020-08-21 LAB — LACTIC ACID, PLASMA: Lactic Acid: 1.4 mmol/L (ref 0.2–2.0)

## 2020-08-21 MED ORDER — ACETAMINOPHEN 650 MG RE SUPP
650.0000 mg | Freq: Four times a day (QID) | RECTAL | Status: DC | PRN
Start: 2020-08-21 — End: 2020-09-03

## 2020-08-21 MED ORDER — ACETAMINOPHEN 325 MG PO TABS
650.0000 mg | ORAL_TABLET | Freq: Four times a day (QID) | ORAL | Status: DC | PRN
Start: 2020-08-21 — End: 2020-08-21

## 2020-08-21 MED ORDER — AMLODIPINE BESYLATE 5 MG PO TABS
5.0000 mg | ORAL_TABLET | Freq: Every day | ORAL | Status: DC
Start: 2020-08-22 — End: 2020-08-21

## 2020-08-21 MED ORDER — OXYCODONE-ACETAMINOPHEN 5-325 MG PO TABS
1.0000 | ORAL_TABLET | ORAL | Status: DC | PRN
Start: 2020-08-21 — End: 2020-08-24
  Administered 2020-08-21 – 2020-08-24 (×7): 1 via ORAL
  Filled 2020-08-21 (×6): qty 1

## 2020-08-21 MED ORDER — POTASSIUM CHLORIDE 10 MEQ/100ML IV SOLN
10.0000 meq | INTRAVENOUS | Status: DC | PRN
Start: 2020-08-21 — End: 2020-09-03

## 2020-08-21 MED ORDER — INSULIN LISPRO 100 UNIT/ML SOLN (WRAP)
1.0000 [IU] | Freq: Three times a day (TID) | Status: DC
Start: 2020-08-22 — End: 2020-09-03
  Filled 2020-08-21 (×3): qty 3

## 2020-08-21 MED ORDER — NALOXONE HCL 0.4 MG/ML IJ SOLN (WRAP)
0.2000 mg | INTRAMUSCULAR | Status: DC | PRN
Start: 2020-08-21 — End: 2020-09-03

## 2020-08-21 MED ORDER — AMLODIPINE BESYLATE 5 MG PO TABS
5.0000 mg | ORAL_TABLET | Freq: Every evening | ORAL | Status: DC
Start: 2020-08-21 — End: 2020-08-24
  Filled 2020-08-21: qty 1

## 2020-08-21 MED ORDER — MORPHINE SULFATE 2 MG/ML IJ/IV SOLN (WRAP)
2.0000 mg | Status: DC | PRN
Start: 2020-08-21 — End: 2020-08-21
  Filled 2020-08-21: qty 1

## 2020-08-21 MED ORDER — GLUCAGON 1 MG IJ SOLR (WRAP)
1.0000 mg | INTRAMUSCULAR | Status: DC | PRN
Start: 2020-08-21 — End: 2020-09-03

## 2020-08-21 MED ORDER — SODIUM CHLORIDE 0.9 % IV MBP
4.5000 g | Freq: Three times a day (TID) | INTRAVENOUS | Status: DC
Start: 2020-08-22 — End: 2020-09-03
  Administered 2020-08-22 – 2020-09-03 (×38): 4.5 g via INTRAVENOUS
  Filled 2020-08-21 (×42): qty 20

## 2020-08-21 MED ORDER — HYDRALAZINE HCL 20 MG/ML IJ SOLN
10.0000 mg | Freq: Four times a day (QID) | INTRAMUSCULAR | Status: DC | PRN
Start: 2020-08-21 — End: 2020-09-03
  Administered 2020-09-01: 10 mg via INTRAVENOUS
  Filled 2020-08-21 (×2): qty 1

## 2020-08-21 MED ORDER — INSULIN LISPRO 100 UNIT/ML SOLN (WRAP)
5.0000 [IU] | Freq: Once | Status: DC
Start: 2020-08-21 — End: 2020-08-21

## 2020-08-21 MED ORDER — MELATONIN 3 MG PO TABS
3.0000 mg | ORAL_TABLET | Freq: Every evening | ORAL | Status: DC | PRN
Start: 2020-08-21 — End: 2020-09-03

## 2020-08-21 MED ORDER — ONDANSETRON HCL 4 MG/2ML IJ SOLN
4.0000 mg | Freq: Four times a day (QID) | INTRAMUSCULAR | Status: AC | PRN
Start: 2020-08-21 — End: 2020-08-22

## 2020-08-21 MED ORDER — PIPERACILLIN SOD-TAZOBACTAM SO 4.5 (4-0.5) G IV SOLR
4.5000 g | Freq: Once | INTRAVENOUS | Status: AC
Start: 2020-08-21 — End: 2020-08-21
  Administered 2020-08-21: 4.5 g via INTRAVENOUS
  Filled 2020-08-21: qty 20

## 2020-08-21 MED ORDER — ACETAMINOPHEN 325 MG PO TABS
650.0000 mg | ORAL_TABLET | Freq: Four times a day (QID) | ORAL | Status: DC | PRN
Start: 2020-08-21 — End: 2020-09-03
  Administered 2020-08-22 – 2020-08-25 (×2): 650 mg via ORAL
  Filled 2020-08-21 (×2): qty 2

## 2020-08-21 MED ORDER — HYDROMORPHONE HCL 0.5 MG/0.5 ML IJ SOLN
0.4000 mg | INTRAMUSCULAR | Status: DC | PRN
Start: 2020-08-21 — End: 2020-08-21

## 2020-08-21 MED ORDER — DEXTROSE 10 % IV BOLUS
25.0000 g | INTRAVENOUS | Status: DC | PRN
Start: 2020-08-21 — End: 2020-09-03
  Filled 2020-08-21: qty 250

## 2020-08-21 MED ORDER — POTASSIUM CHLORIDE CRYS ER 20 MEQ PO TBCR
0.0000 meq | EXTENDED_RELEASE_TABLET | ORAL | Status: DC | PRN
Start: 2020-08-21 — End: 2020-09-03
  Filled 2020-08-21 (×2): qty 1

## 2020-08-21 MED ORDER — HEPARIN SODIUM (PORCINE) 5000 UNIT/ML IJ SOLN
5000.0000 [IU] | Freq: Two times a day (BID) | INTRAMUSCULAR | Status: DC
Start: 2020-08-21 — End: 2020-09-03
  Administered 2020-08-21 – 2020-08-22 (×2): 5000 [IU] via SUBCUTANEOUS
  Filled 2020-08-21 (×8): qty 1

## 2020-08-21 MED ORDER — INSULIN LISPRO 100 UNIT/ML SOLN (WRAP)
1.0000 [IU] | Freq: Every evening | Status: DC
Start: 2020-08-21 — End: 2020-09-03
  Filled 2020-08-21: qty 6

## 2020-08-21 MED ORDER — INSULIN GLARGINE 100 UNIT/ML SC SOLN
20.0000 [IU] | Freq: Every evening | SUBCUTANEOUS | Status: DC
Start: 2020-08-21 — End: 2020-09-03
  Filled 2020-08-21 (×2): qty 20

## 2020-08-21 MED ORDER — VANCOMYCIN 1000 MG IN 250 ML NS IVPB VIAL-MATE (CNR)
1000.0000 mg | Freq: Once | INTRAVENOUS | Status: AC
Start: 2020-08-21 — End: 2020-08-21
  Administered 2020-08-21: 1000 mg via INTRAVENOUS
  Filled 2020-08-21: qty 250

## 2020-08-21 MED ORDER — INSULIN LISPRO 100 UNIT/ML SOLN (WRAP)
3.0000 [IU] | Freq: Once | Status: DC
Start: 2020-08-21 — End: 2020-09-03

## 2020-08-21 MED ORDER — MAGNESIUM SULFATE IN D5W 1-5 GM/100ML-% IV SOLN
1.0000 g | INTRAVENOUS | Status: DC | PRN
Start: 2020-08-21 — End: 2020-09-03

## 2020-08-21 MED ORDER — VANCOMYCIN PHARMACY TO DOSE PLACEHOLDER
INTRAVENOUS | Status: DC
Start: 2020-08-21 — End: 2020-08-27

## 2020-08-21 MED ORDER — DEXTROSE 5% IV BOLUS
250.0000 mL | INTRAVENOUS | Status: DC | PRN
Start: 2020-08-21 — End: 2020-09-03

## 2020-08-21 MED ORDER — POTASSIUM & SODIUM PHOSPHATES 280-160-250 MG PO PACK
2.0000 | PACK | ORAL | Status: DC | PRN
Start: 2020-08-21 — End: 2020-09-03

## 2020-08-21 MED ORDER — DEXTROSE 50 % IV SOLN
25.0000 g | INTRAVENOUS | Status: DC | PRN
Start: 2020-08-21 — End: 2020-09-03

## 2020-08-21 NOTE — Plan of Care (Signed)
Problem: Safety  Goal: Patient will be free from injury during hospitalization  Outcome: Progressing  Flowsheets (Taken 08/21/2020 2354)  Patient will be free from injury during hospitalization:   Assess patient's risk for falls and implement fall prevention plan of care per policy   Provide and maintain safe environment  Goal: Patient will be free from infection during hospitalization  Outcome: Progressing  Flowsheets (Taken 08/21/2020 2354)  Free from Infection during hospitalization:   Assess and monitor for signs and symptoms of infection   Monitor lab/diagnostic results   Monitor all insertion sites (i.e. indwelling lines, tubes, urinary catheters, and drains)     Problem: Pain  Goal: Pain at adequate level as identified by patient  Outcome: Progressing  Flowsheets (Taken 08/21/2020 2354)  Pain at adequate level as identified by patient:   Assess pain on admission, during daily assessment and/or before any "as needed" intervention(s)   Reassess pain within 30-60 minutes of any procedure/intervention, per Pain Assessment, Intervention, Reassessment (AIR) Cycle   Evaluate if patient comfort function goal is met

## 2020-08-21 NOTE — ED Notes (Signed)
[facility] EMERGENCY DEPARTMENT  ED NURSING NOTE FOR THE RECEIVING INPATIENT NURSE   ED NURSE [address]   ED CHARGE RN Caryn Bee    ADMISSION INFORMATION   Jarmarcus Wambold is a 60 y.o. male admitted with an ED diagnosis of:    1. Cellulitis of left lower extremity         Isolation: Contact MRSA   Allergies: Molds & smuts   Holding Orders confirmed? Yes   Belongings Documented? No   Home medications sent to pharmacy confirmed? No   NURSING CARE   Patient Comes From:   Mental Status: Home Independent  alert and oriented   ADL: Independent with all ADLs   Ambulation: no difficulty   Pertinent Information  and Safety Concerns: -     CT / NIH   CT Head ordered on this patient?  No   NIH/Dysphagia assessment done prior to admission? No   VITAL SIGNS (at the time of this note)      Vitals:    08/21/20 1855   BP: 159/88   Pulse: 86   Resp: 17   Temp: 99.3 F (37.4 C)   SpO2: 96%

## 2020-08-21 NOTE — Consults (Signed)
Department of Pharmacy - Vancomycin Dosing Progress Note    Patient: Daniel Hebert   Room/Bed: V409/W119-14  Pharmacy Dosing Consult Requested by: Dr. Betsy Pries    Assessment   Vancomycin indication: cellulitis- skin and soft tissue infection.  Patient weight: 100.7 kg (222 lb)     Renal Function:   Serum creatinine: 1.1 mg/dL 78/29/56 2130  Estimated creatinine clearance: 84.3 mL/min        Vancomycin Regimen:  Doses received: Vancomycin 1000 mg iv x 1 at 19:36pm    No results for input(s): VANCO in the last 72 hours.      Pharmacy Plan     Vancomycin 1000 mg iv q12h  Trough not ordered at this time.    Pharmacy will continue to follow, and order levels and/or adjust vancomycin doses as necessary.  Please contact pharmacy with any questions at  x [phone].

## 2020-08-21 NOTE — ED Provider Notes (Signed)
History     Chief Complaint   Patient presents with    Foot Pain     Kicked a 2x4 2019, since then has had persistent rigfht foot pain. Recently had a bilateral knee arthroscopy and he had a normal [place] of his right leg. The ultrasound was normal, but they diagnosed him with foot cellulitis. He was put on antibiotics . Stopped insulin 21 weeks ago. Went to a podiatrist and he was told to put a pad under his foot. He was diagnosed with Charcot foot.        Past Medical History:   Diagnosis Date    Biceps muscle tear     Brain tumor     "non malignent" no deficits    Deviated septum     Disorder of musculoskeletal system     left/right shoulder pain    Gastroesophageal reflux disease     Malignant neoplasm 2015    brain tumor    Other specified health status 2017-2018    MRSA right toe    Other specified health status 2016    [place] accident[place]     Rash     right arm scar tissue    Type 2 diabetes mellitus, controlled 2007    Patient does not know what his A1c or FBS       Past Surgical History:   Procedure Laterality Date    BRAIN SURGERY  2015    head surgery      KNEE SURGERY  2017    right    SEPTOPLASTY SMR, TURBINATES N/A 10/12/2017    Procedure: SEPTOPLASTY SMR, TURBINATES;  Surgeon: Shelda Pal, MD;  Location: [facility];  Service: ENT;  Laterality: N/A;  SEPTOPLASTY, INFERIOR TURBINATE RESECTION    SHOULDER SURGERY  2017    right    TONSILLECTOMY  2014       No family history on file.    Social  Social History     Tobacco Use    Smoking status: Never    Smokeless tobacco: Never   Vaping Use    Vaping Use: Never used   Substance Use Topics    Alcohol use: No    Drug use: No       .     Allergies   Allergen Reactions    Molds & Smuts      difficulity breathing       Home Medications               empagliflozin (JARDIANCE) 10 MG tablet     Take 10 mg by mouth every morning     folic acid (FOLVITE) 800 MCG tablet     Take 800 mcg by mouth daily         glimepiride (AMARYL) 4 MG tablet      Take 4 mg by mouth 2 (two) times daily         levocetirizine (XYZAL) 5 MG tablet     Take 5 mg by mouth every evening     lovastatin (MEVACOR) 20 MG tablet     Take 20 mg by mouth nightly     montelukast (SINGULAIR) 10 MG tablet     Take 10 mg by mouth nightly     omeprazole (PRILOSEC) 40 MG capsule     Take 40 mg by mouth 2 (two) times daily         SITagliptin (JANUVIA) 100 MG tablet     Take 100  mg by mouth daily     vitamin D (CHOLECALCIFEROL) 1000 UNIT tablet     Take 1,000 Units by mouth daily             Review of Systems   Constitutional:  Negative for activity change, chills and fever.   HENT:  Negative for congestion and facial swelling.    Eyes:  Negative for visual disturbance.   Respiratory:  Negative for cough, shortness of breath and wheezing.    Cardiovascular:  Negative for chest pain, palpitations and leg swelling.   Gastrointestinal:  Negative for abdominal distention, abdominal pain, diarrhea, nausea and vomiting.   Genitourinary:  Negative for dysuria and flank pain.   Musculoskeletal:  Positive for joint swelling. Negative for back pain.   Skin:  Negative for rash.   Neurological:  Negative for seizures, syncope, facial asymmetry, speech difficulty, weakness, light-headedness, numbness and headaches.     Physical Exam    BP: (!) 179/119, Heart Rate: 99, Temp: 98.1 F (36.7 C), Resp Rate: 18, SpO2: 97 %, Weight: 101.9 kg    Physical Exam  Vitals and nursing note reviewed. Exam conducted with a chaperone present.   Constitutional:       Appearance: Normal appearance. He is normal weight.   HENT:      Head: Normocephalic and atraumatic.   Eyes:      Extraocular Movements: Extraocular movements intact.      Pupils: Pupils are equal, round, and reactive to light.   Pulmonary:      Effort: Pulmonary effort is normal. No respiratory distress.   Abdominal:      General: There is no distension.   Musculoskeletal:         General: Normal range of motion.      Cervical back: Normal range of motion.       Comments: 2+ DP RLE, moderate swelling of the right foot patient endorses as chronic, pitting. The 2nd digit has a lesion and opening at the distant aspect a/w swelling. Redness over that toe along with the forefoot. 2s cap refill. SILT. No swelling in the calf nor tense copmartments.    Skin:     Findings: No rash.   Neurological:      General: No focal deficit present.      Mental Status: He is alert and oriented to person, place, and time. Mental status is at baseline.         MDM and ED Course     ED Medication Orders (From admission, onward)      None               MDM  Number of Diagnoses or Management Options  Diagnosis management comments: Patient with right foot cellulitis, suspected osteomyelitis and possible gangrene  -IV antibiotics  -Admission          ED Course as of 08/21/20 1843   Tue Aug 21, 2020   1745 D/w April, [place] on call for Dr.Switaj. He recommended he see ER for IV antibiotics and podiatry consultation.  [ND]      ED Course User Index  [ND] Tyrone Sage, Bary Castilla, MD             Procedures    Clinical Impression & Disposition     Clinical Impression  Final diagnoses:   None        ED Disposition       None  New Prescriptions    No medications on file                   Gustavo Lah, MD  08/21/20 1845

## 2020-08-21 NOTE — H&P (Signed)
[facility]      Patient: Daniel Hebert  Date: 08/21/2020   DOB: 03-01-1960  Admission Date: 08/21/2020   MRN: [id number]  Attending: Burnett Kanaris DO       Chief Complaint   Patient presents with    Foot Pain      History Gathered From: Self    HISTORY AND PHYSICAL     Daniel Hebert is a 60 y.o. 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 Right Foot OM, DM Type II, HTN, HLD, Hx. Meningioma, GERD who presented with Right Foot Pain.    Patient reports for the past 4-5 days he has had progressive redness, pain, and swelling of his right foot. He has had chronic foot ulcer and recurrent infections in the past and has been evaluated by many different Podiatrists and Ortho Ankle/Foot physicians recently. He had MRI recently which showed chronic stable changes related to the foot and no acute OM or septic arthritis. The patient is determined not to undergo any amputations and has poor opinion of Podiatry as a field stating that they have mismanaged his condition. He was seen by Foot/Ankle Ortho Dr. Mosetta Putt who recommended coming to the hospital for IV Abx and Podiatry evaluation. In the ED, hemodynamically stable, started on Vanc+Zosyn and admitted for further management. On my discussion with the patient, he was very resistant to having Podiatry consult, but on further discussion, has agreed to evaluation by them in the AM.    VW:UJWJXB smoking tobacco, drinking alcohol, or using illicit drugs    JY:NWGNFA with patient, non-contributory other than stated below    Past Medical History:   Diagnosis Date    Biceps muscle tear     Brain tumor     "non malignent" no deficits    Deviated septum     Disorder of musculoskeletal system     left/right shoulder pain    Gastroesophageal reflux disease     Malignant neoplasm 2015    brain tumor    Other specified health status 2017-2018    MRSA right toe    Other specified health status 2016    [place]     Rash     right arm scar  tissue    Type 2 diabetes mellitus, controlled 2007    Patient does not know what his A1c or FBS       Past Surgical History:   Procedure Laterality Date    BRAIN SURGERY  2015    head surgery      KNEE SURGERY  2017    right    SEPTOPLASTY SMR, TURBINATES N/A 10/12/2017    Procedure: SEPTOPLASTY SMR, TURBINATES;  Surgeon: Shelda Pal, MD;  Location: [facility];  Service: ENT;  Laterality: N/A;  SEPTOPLASTY, INFERIOR TURBINATE RESECTION    SHOULDER SURGERY  2017    right    TONSILLECTOMY  2014       Prior to Admission medications    Medication Sig Start Date End Date Taking? Authorizing Provider   empagliflozin (JARDIANCE) 10 MG tablet Take 10 mg by mouth every morning    [provider]   folic acid (FOLVITE) 800 MCG tablet Take 800 mcg by mouth daily        [provider]   glimepiride (AMARYL) 4 MG tablet Take 4 mg by mouth 2 (two) times daily        [provider]   levocetirizine (  XYZAL) 5 MG tablet Take 5 mg by mouth every evening    [provider]   lovastatin (MEVACOR) 20 MG tablet Take 20 mg by mouth nightly    [provider]   montelukast (SINGULAIR) 10 MG tablet Take 10 mg by mouth nightly    [provider]   omeprazole (PRILOSEC) 40 MG capsule Take 40 mg by mouth 2 (two) times daily        [provider]   SITagliptin (JANUVIA) 100 MG tablet Take 100 mg by mouth daily    [provider]   vitamin D (CHOLECALCIFEROL) 1000 UNIT tablet Take 1,000 Units by mouth daily    [provider]       Allergies   Allergen Reactions    Molds & Smuts      difficulity breathing       CODE STATUS: full code    PRIMARY CARE MD: Patsy Lager, MD    History reviewed. No pertinent family history.    Social History     Tobacco Use    Smoking status: Never    Smokeless tobacco: Never   Vaping Use    Vaping Use: Never used   Substance Use Topics    Alcohol use: No    Drug use: No       REVIEW OF SYSTEMS     Ten point review of  systems negative or as per HPI and below endorsements.    PHYSICAL EXAM     Vital Signs (most recent): BP (!) 172/92   Pulse 93   Temp 98.4 F (36.9 C) (Oral)   Resp 19   Wt 101.9 kg (224 lb 10.4 oz)   SpO2 97%   BMI 28.08 kg/m   Constitutional: NAD, A+OX3   HEENT: NC/AT, PERRL, no scleral icterus or conjunctival pallor, no nasal discharge, MMM, oropharynx without erythema or exudate  Neck: trachea midline, supple, no cervical or supraclavicular lymphadenopathy or masses  Cardiovascular: RRR, normal S1 S2, no murmurs, gallops, palpable thrills, no JVD, Non-displaced PMI.  Respiratory: Normal rate. No retractions or increased work of breathing. Clear to auscultation and percussion bilaterally.  Gastrointestinal: +BS, non-distended, soft, non-tender, no rebound or guarding, no hepatosplenomegaly  Genitourinary: no suprapubic or costovertebral angle tenderness  Musculoskeletal: ROM and motor strength grossly normal. Right foot edema with significantly edematous right 2nd digit with lesion/ulcer at distal aspect, no noted purulent drainage  Skin: Right foot erythema and ulceration  Neurologic: EOMI, CN 2-12 grossly intact. no gross motor or sensory deficits, patellar and bicep DTR 2+ and symmetric, downward plantar reflexes  Psychiatric: AAOx3, affect and mood appropriate. The patient is alert, interactive, appropriate.    LABS & IMAGING     Recent Results (from the past 24 hour(s))   Lactic Acid    Collection Time: 08/21/20  5:35 PM   Result Value Ref Range    Lactic Acid 1.4 0.2 - 2.0 mmol/L   CBC and differential    Collection Time: 08/21/20  5:36 PM   Result Value Ref Range    WBC 5.20 3.10 - 9.50 x10 3/uL    Hgb 14.5 12.5 - 17.1 g/dL    Hematocrit 16.1 09.6 - 49.6 %    Platelets 236 142 - 346 x10 3/uL    RBC 4.92 4.20 - 5.90 x10 6/uL    MCV 86.0 78.0 - 96.0 fL    MCH 29.5 25.1 - 33.5 pg    MCHC 34.3 31.5 - 35.8 g/dL  RDW 12 11 - 15 %    MPV 8.7 (L) 8.9 - 12.5 fL    Neutrophils 64.9 None %    Lymphocytes  Automated 18.1 None %    Monocytes 13.7 None %    Eosinophils Automated 1.9 None %    Basophils Automated 0.8 None %    Immature Granulocytes 0.6 None %    Nucleated RBC 0.0 0.0 - 0.0 /100 WBC    Neutrophils Absolute 3.38 1.10 - 6.33 x10 3/uL    Lymphocytes Absolute Automated 0.94 0.42 - 3.22 x10 3/uL    Monocytes Absolute Automated 0.71 0.21 - 0.85 x10 3/uL    Eosinophils Absolute Automated 0.10 0.00 - 0.44 x10 3/uL    Basophils Absolute Automated 0.04 0.00 - 0.08 x10 3/uL    Immature Granulocytes Absolute 0.03 0.00 - 0.07 x10 3/uL    Absolute NRBC 0.00 0.00 - 0.00 x10 3/uL   Comprehensive metabolic panel    Collection Time: 08/21/20  5:36 PM   Result Value Ref Range    Glucose 301 (H) 70 - 100 mg/dL    BUN 21 9 - 28 mg/dL    Creatinine 1.1 0.7 - 1.3 mg/dL    Sodium 161 096 - 045 mEq/L    Potassium 4.0 3.5 - 5.1 mEq/L    Chloride 100 100 - 111 mEq/L    CO2 25 22 - 29 mEq/L    Calcium 9.9 8.5 - 10.5 mg/dL    Protein, Total 7.2 6.0 - 8.3 g/dL    Albumin 3.5 3.5 - 5.0 g/dL    AST (SGOT) 25 5 - 34 U/L    ALT 40 0 - 55 U/L    Alkaline Phosphatase 99 38 - 106 U/L    Bilirubin, Total 0.4 0.2 - 1.2 mg/dL    Globulin 3.7 (H) 2.0 - 3.6 g/dL    Albumin/Globulin Ratio 0.9 0.9 - 2.2    Anion Gap 11.0 5.0 - 15.0   Sedimentation rate (ESR)    Collection Time: 08/21/20  5:36 PM   Result Value Ref Range    Sed Rate 38 (H) 0 - 15 mm/Hr   GFR    Collection Time: 08/21/20  5:36 PM   Result Value Ref Range    EGFR >60.0         MICROBIOLOGY:  Blood Culture: done  Urine Culture: not done  Antibiotics Started:     IMAGING (xray images personally reviewed and concur with radiologist unless otherwise stated below):  Foot Right AP Lateral And Oblique    (Results Pending)       CARDIAC:  EKG Interpretation (on my review 3):  sinus rhythm    Markers:        EMERGENCY DEPARTMENT COURSE:  Orders Placed This Encounter   Procedures    Culture Blood Aerobic and Anaerobic    Culture Blood Aerobic and Anaerobic    Foot Right AP Lateral And Oblique     CBC and differential    Comprehensive metabolic panel    Sedimentation rate (ESR)    Lactic Acid    GFR    ED Unit Sec Comm Order     No data recorded  ASSESSMENT & PLAN     Daniel Hebert is a 60 y.o. male admitted under INPATIENT with Foot Cellulitis vs. OM.  I expect an inpatient to remain in the hospital for more than 2 midnights due to IV Abx, cultures, possible podiatry evaluation.      1. Right  Foot Cellulitis vs. OM in the setting of Chronic DFU  Patient sent in by Ortho Dr. Mosetta Putt who recommended Podiatry evaluation  Not septic on admission, patient is hemodynamically stable, LA neg  Right Foot XR pending; MRI Right Foot (07/14/20) showed no evidence of septic arthritis or OM stable from 05/20/20, given new infection will obtain repeat MRI Foot to evaluate for OM, ESR elevated  Started on Vanc+Zosyn in ED, will continue, cultures pending, consult ID in AM  Patient has reservations about seeing Podiatrist, he has agreed to be seen by Podiatry here, Dr. Alanda Slim consulted  Continue analgesia    2. Hypertension  Patient discontinued all home medications, start Amlodipine  IV Hydralazine with parameters    3. Uncontrolled DM with Hyperglycemia  A1c 8.4 (12/21), repeat pending  Hold oral home meds  Lantus 20U QHS (degludec 50 at home reportedly, patient said he has stopped), SSI+Accucheks    4. HLD  Patient discontinued home statin    5. Body mass index is 28.08 kg/m.: Patient is overweight. Lifestyle modifications discussed.     Safety Checklist  DVT prophylaxis: Chemical and Mechanical   Foley: Not present   IVs:  Peripheral IV   PT/OT: Not needed   Daily CBC & or Chem ordered: Yes, due to clinical and lab instability       Patient Lines/Drains/Airways Status       Active PICC Line / CVC Line / PIV Line / Drain / Airway / Intraosseous Line / Epidural Line / ART Line / Line / Wound / Pressure Ulcer / NG/OG Tube       Name Placement date Placement time Site Days    Peripheral IV 08/21/20 20 G Standard  Left Antecubital 08/21/20  1730  Antecubital  less than 1                    All questions answered to the satisfaction of patient and family. Patient made aware that I or covering hospitalist team member are easily reachable to further discuss care if questions or symptoms arise. Instructed to notify nurse if change in condition or other concerns.     Fonda Kinder, DO  08/21/2020 6:39 PM    This note was generated by the [facility] EMR system/Dragon speech recognition and may contain inherent errors or omissions not intended by the user. Grammatical errors, random word insertions, deletions, pronoun errors and incomplete sentences are occasional consequences of this technology due to software limitations. Not all errors are caught or corrected. If there are questions or concerns about the content of this note or information contained within the body of this dictation they should be addressed directly with the author for clarification.

## 2020-08-22 ENCOUNTER — Inpatient Hospital Stay: Payer: BC Managed Care – PPO

## 2020-08-22 LAB — BASIC METABOLIC PANEL
Anion Gap: 10 (ref 5.0–15.0)
BUN: 13 mg/dL (ref 9–28)
CO2: 22 mEq/L (ref 22–29)
Calcium: 9 mg/dL (ref 8.5–10.5)
Chloride: 104 mEq/L (ref 100–111)
Creatinine: 0.8 mg/dL (ref 0.7–1.3)
Glucose: 217 mg/dL — ABNORMAL HIGH (ref 70–100)
Potassium: 3.8 mEq/L (ref 3.5–5.1)
Sodium: 136 mEq/L (ref 136–145)

## 2020-08-22 LAB — MAGNESIUM: Magnesium: 1.9 mg/dL (ref 1.6–2.6)

## 2020-08-22 LAB — HEMOGLOBIN A1C
Average Estimated Glucose: 203 mg/dL
Hemoglobin A1C: 8.7 % — ABNORMAL HIGH (ref 4.6–5.9)

## 2020-08-22 LAB — CBC
Absolute NRBC: 0 10*3/uL (ref 0.00–0.00)
Hematocrit: 39.5 % (ref 37.6–49.6)
Hgb: 13.1 g/dL (ref 12.5–17.1)
MCH: 28.6 pg (ref 25.1–33.5)
MCHC: 33.2 g/dL (ref 31.5–35.8)
MCV: 86.2 fL (ref 78.0–96.0)
MPV: 8.9 fL (ref 8.9–12.5)
Nucleated RBC: 0 /100 WBC (ref 0.0–0.0)
Platelets: 219 10*3/uL (ref 142–346)
RBC: 4.58 10*6/uL (ref 4.20–5.90)
RDW: 13 % (ref 11–15)
WBC: 5.08 10*3/uL (ref 3.10–9.50)

## 2020-08-22 LAB — GFR: EGFR: >60

## 2020-08-22 LAB — PT AND APTT
PT INR: 1.3 — ABNORMAL HIGH (ref 0.9–1.1)
PT: 15.6 s — ABNORMAL HIGH (ref 10.1–12.9)
PTT: 27 s (ref 27–39)

## 2020-08-22 LAB — GLUCOSE WHOLE BLOOD - POCT
Whole Blood Glucose POCT: 224 mg/dL — ABNORMAL HIGH (ref 70–100)
Whole Blood Glucose POCT: 303 mg/dL — ABNORMAL HIGH (ref 70–100)
Whole Blood Glucose POCT: 329 mg/dL — ABNORMAL HIGH (ref 70–100)

## 2020-08-22 MED ORDER — LORAZEPAM 0.5 MG PO TABS
0.5000 mg | ORAL_TABLET | Freq: Once | ORAL | Status: AC
Start: 2020-08-22 — End: 2020-08-22
  Administered 2020-08-22: 0.5 mg via ORAL
  Filled 2020-08-22: qty 1

## 2020-08-22 MED ORDER — CADEXOMER IODINE 0.9 % EX GEL
Freq: Every day | CUTANEOUS | Status: DC
Start: 2020-08-22 — End: 2020-09-03
  Filled 2020-08-22 (×2): qty 40

## 2020-08-22 MED ORDER — GADOBUTROL 1 MMOL/ML IV SOSY (WRAP)
10.0000 mL | Freq: Once | INTRAVENOUS | Status: AC | PRN
Start: 2020-08-22 — End: 2020-08-22
  Administered 2020-08-22: 10 mL via INTRAVENOUS

## 2020-08-22 MED ORDER — VANCOMYCIN HCL IN NACL 1.25-0.9 GM/250ML-% IV SOLN
15.0000 mg/kg | Freq: Once | INTRAVENOUS | Status: AC
Start: 2020-08-22 — End: 2020-08-22
  Administered 2020-08-22: 1250 mg via INTRAVENOUS
  Filled 2020-08-22: qty 250

## 2020-08-22 MED ORDER — VANCOMYCIN HCL IN NACL 1.25-0.9 GM/250ML-% IV SOLN
1250.0000 mg | Freq: Two times a day (BID) | INTRAVENOUS | Status: DC
Start: 2020-08-23 — End: 2020-08-24
  Administered 2020-08-22 – 2020-08-24 (×4): 1250 mg via INTRAVENOUS
  Filled 2020-08-22 (×6): qty 250

## 2020-08-22 NOTE — Consults (Signed)
Wound Ostomy Continence Consultation    Date Time: 08/22/20 10:21 AM  Patient Name: Daniel Hebert, Daniel Hebert  Consulting Service: [facility] Day: 2     Reason for Consult   Diabetic Wound     Assessment & Plan   Assessment:   Pt agitated and easily escalates/excitable, noted with flight of ideas and illogical thought processes.  Difficult to redirect pt back to topic at hand d/t frequency of flight of ideas.      Right foot, 2nd toe noted with edema and erythema with open ulceration at the distal tip.  Per podiatry evaluation this morning, ulcer probes to bone.  Extensive discussion with pt re: the objective/non-disputable findings that include an xray that indicates osteomyelitis of the distal 2nd toe and a HgbA1c of 8.7 that indicates dx of uncontrolled DM.  Discussed recommendation for a partial amputation of the right 2nd toe from Podiatry.  Discussed that any local wound care management will be unsuccessful in the setting of osteomyelitis and uncontrolled DM.  Ask pt what treatment plan he would like, pt brings out an informational flier from the [facility] for HBO therapy.  Advised pt that there is qualifying criteria that needs to be met for HBO therapy and that is not something that is done in the inpatient setting, advised pt that a referral can be made to the [facility] for management in the outpt setting and investigation into HBO therapy.  Pt is agreeable to this.    Discussed use of Iodosorb, pt states he has been using "herbs" on the foot, advised pt that herbs are not recommended and if he continues to use them, then no local orders will be placed by myself.  Pt agrees to not use "herbs", he will "see how your medication looks on my foot and then decide".      Wound Photography:   Right Foot, dorsal aspect      Right foot/toes, distal tip      Right Foot, plantar aspect        Plan/Follow-Up:  Ultimately recommend pt follows the recommendations from Podiatry, though as it appears pt is not going to do  so, local care recommendations below  Recommend Iodosorb to the right foot, 2nd toe daily  Recommend referral to the [facility] as pt is requesting HBO therapy  Pt may benefit from a Psych evaluation  Discussed plan/recommendations with primary RN and Hospitalist    Objective Findings   Braden Scale Score: 20 (08/21/20 2320)  Braden Subscales:  Sensory Perceptions: Slightly limited (08/21/20 2320)  Moisture: Rarely moist (08/21/20 2320)  Activity: Walks occasionally (08/21/20 2320)  Mobility: Slightly limited (08/21/20 2320)  Nutrition: Excellent (08/21/20 2320)  Friction and Shear: No apparent problem (08/21/20 2320)    Ht Readings from Last 1 Encounters:   08/21/20 1.905 m (6\' 3" )     Wt Readings from Last 3 Encounters:   08/21/20 100.7 kg (222 lb)   11/09/17 98.9 kg (218 lb)   10/12/17 98.9 kg (218 lb 0.6 oz)     Body mass index is 27.75 kg/m.    Current Diet:   Diet consistent carbohydrate  Supervise For Meals Frequency: All meals     Specialty Bed:   Versacare with AccuMax appropriate    History of Present Illness   This is a 60 y.o. male  has a past medical history of Biceps muscle tear, Brain tumor, Deviated septum, Disorder of musculoskeletal system, Gastroesophageal reflux disease, Malignant neoplasm (2015), Other specified health  status (2017-2018), Other specified health status (2016), Rash, and Type 2 diabetes mellitus, controlled (2007)..  Admitted with Cellulitis of left lower extremity      Pertinent skin and/or ostomy history includes uncontrolled DM and chronic nonhealing right foot/2nd toe ulceration.      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  Confirms primary residence is in [place].  Pts statements indicate that he is distrusting of all healthcare workers from past experiences.      Lab   Significant Lab Values:    Recent Labs     08/22/20  0609 08/21/20  1736   WBC 5.08 5.20   RBC 4.58 4.92   Hgb 13.1 14.5    Hematocrit 39.5 42.3   Sodium 136 136   Potassium 3.8 4.0   Chloride 104 100   CO2 22 25   BUN 13 21   Creatinine 0.8 1.1   Calcium 9.0 9.9   Albumin  --  3.5   EGFR >60.0 >60.0   Glucose 217* 301*       Daniel Azlin Zilberman MSN, RN, [facility]  Wound and [employer]   [phone]

## 2020-08-22 NOTE — Nursing Progress Note (Addendum)
Pt is alert and oriented to self, place, and situation. Disoriented to time. Independent with in room ambulation. Refused all medications this shift except IV abx and pain medication; educated on importance of compliance with no success. BP and blood sugars have been consistently elevated-- -- pt refusing ordered BP meds and insulin, all providers aware.     Safe Team activated this AM due to concerning comments, including repeated statements that staff in ED "will be sorry." Belongings searched-- two knives being stored by security.

## 2020-08-22 NOTE — Plan of Care (Signed)
Problem: Diabetes: Glucose Imbalance  Goal: Blood glucose stable at established goal  Outcome: Progressing  Flowsheets (Taken 08/22/2020 1020)  Blood glucose stable at established goal:   Monitor lab values   Ensure patient/family has adequate teaching materials   Ensure adequate hydration   Ensure appropriate consults are obtained (Nutrition, Diabetes Education, and Case Management)   Assess for hypoglycemia /hyperglycemia   Monitor/assess vital signs   Ensure appropriate diet and assess tolerance   Include patient/family in decisions related to nutrition/dietary selections   Monitor intake and output.  Notify LIP if urine output is < 30 mL/hour.     Problem: Compromised Tissue integrity  Goal: Damaged tissue is healing and protected  Outcome: Progressing  Flowsheets (Taken 08/22/2020 1020)  Damaged tissue is healing and protected:   Monitor/assess Braden scale every shift   Provide wound care per wound care algorithm   Increase activity as tolerated/progressive mobility   Monitor patient's hygiene practices   Keep intact skin clean and dry  Goal: Nutritional status is improving  Flowsheets (Taken 08/22/2020 1020)  Nutritional status is improving:   Allow adequate time for meals   Encourage patient to take dietary supplement(s) as ordered   Include patient/patient care companion in decisions related to nutrition   Collaborate with Clinical Nutritionist     Problem: Psychosocial and Spiritual Needs  Goal: Demonstrates ability to cope with hospitalization/illness  Outcome: Progressing  Flowsheets (Taken 08/22/2020 1020)  Demonstrates ability to cope with hospitalizations/illness:   Encourage verbalization of feelings/concerns/expectations   Provide quiet environment   Include patient/ patient care companion in decisions   Reinforce positive adaptation of new coping behaviors   Assist patient to identify own strengths and abilities     Problem: Discharge Barriers  Goal: Patient will be discharged home or other facility  with appropriate resources  Outcome: Progressing  Flowsheets (Taken 08/22/2020 1020)  Discharge to home or other facility with appropriate resources:   Provide appropriate patient education   Initiate discharge planning

## 2020-08-22 NOTE — Progress Notes (Signed)
Pt admitted to room 488 at 2130. Alert and oriented x4. Ambulates independently. Pt c/o pain in R foot, requested for percocet and provider ordered percocet. Pt reported his pain got better with percocet. Pt refused to take insulin and BP medications. Teaching on the health impacts of uncontrolled BP and blood glucose levels given to the pt. Pt stated that taking  the medications cause more damage than not taking. Dr Newell Coral notified.     Skin checked with Melven Sartorius, RLE redness and R second toe wound noted. Otherwise skin intact.
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
## 2020-08-22 NOTE — Progress Notes (Signed)
Nutrition Assessment    Daniel Hebert 60 y.o. male   MRN: [id number]      Reason for Assessment: MST-2 (Unsure to weight loss).      Nutrition Recommendation:   Goal: Maintain PO intake >75% of meals. BG levels <180 mg/dL.     Plan:   1) Continue Consistent Carbohydrate diet      2) Recommend further adjustment of insulin regimen for optimal glycemic control <180 mg/dL (BG of 540-981 mg/dL X91 hr)        Donetta Potts, RDN, LDN  Clinical Dietitian  [phone]    ______________________________________________________________________      Assessment Data:  Summary: 60 y.o. male who presented with R Foot Pain. 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 Recent MRI showed no acute OM or septic arthritis to R foot. Foot/Ankle Ortho recommended pt to hospital for IV abx and Podiatry eval. In the ED, hemodynamically stable, started on Vanc+Zosyn and admitted for further management. LA neg. Cx pending. Podiatry following. Recommending at minimum a Partial amputation of 2nd toe R foot as surgical cure of OM. Pt is determined not to undergo any amputations. Pt believes that a couple days of IV abx and possibility of receiving Hyperbaric oxygen and his concoction of herbs will cure infection R foot. Pt informed that a delay in surgery may require a Total 2nd toe amputation. Pt is willing to take his chances.       Pt seen, awake and alert. Pt is very agitated and verbally aggressive. C/o medical tram misdiagnosing him since 2017. Reports good appetite PTA. Mentioned having a BG of 50 mg/dL and consumed 2 cheeseburgers and a bunch of other food but BG only went up to 65 mg/dL so he knows his pancrease is working. Takes Cinnamon and herbs at home. Stated that appetite remains stable. Observed 100% of breakfast consumed this AM. Reports NKFA. Denies difficulties chewing/swallowing. No N/V. Unable to obtain information regarding LBM-  interview interrupted. Pt reports intentional wt loss. Attributes wt loss to working out and medication adjustment. Stated that he was never fat. Reports weight of ~255# in February 2022 and was 235# 2 months ago. Current wt now down to 224#. See wt hx detailed below. No signs of muscle/fat wasting observed per NFPE as detailed below. Noted +2 edema to RLE. R foot with wound. Pt denies nutrition-related questions about the Consistent Carbohydrate diet. Stated that "he knows that he's not supposed to eat sugar." Noted pt with BG of 217-342 mg/dL F62 hr and ZHYQ6V was 8.7 on 08/22/2020 . Will monitor.       Weight Monitoring Weight Weight Method BMI (calculated)   10/12/2017 98.9 kg Standing Scale 27.3 kg/m2   08/21/2020 101.9 kg Standing Scale    08/21/2020 100.699 kg Stated 27.8 kg/m2       Adm dx:  Cellulitis of left lower extremity   Patient Active Problem List   Diagnosis    Cellulitis of left lower extremity       PMH:  has a past medical history of Biceps muscle tear, Brain tumor, Deviated septum, Disorder of musculoskeletal system, Gastroesophageal reflux disease, Malignant neoplasm (2015), Other specified health status (2017-2018), Other specified health status (2016), Rash, and Type 2 diabetes mellitus, controlled (2007).    Recent Labs   Lab 08/22/20  0609 08/21/20  1736   Sodium 136 136   Potassium 3.8 4.0   Chloride 104  100   CO2 22 25   BUN 13 21   Creatinine 0.8 1.1   Glucose 217* 301*   Calcium 9.0 9.9   Magnesium 1.9  --    EGFR >60.0 >60.0   WBC 5.08 5.20   Hematocrit 39.5 42.3   Hgb 13.1 14.5   Bilirubin, Total  --  0.4   AST (SGOT)  --  25   ALT  --  40   Alkaline Phosphatase  --  99       Recent Labs   Lab 08/22/20  0749 08/21/20  2127   Whole Blood Glucose POCT 224* 342*       Current Facility-Administered Medications   Medication Dose Route Frequency    amLODIPine  5 mg Oral QPM    heparin (porcine)  5,000 Units Subcutaneous Q12H SCH    insulin glargine  20 Units Subcutaneous QHS    insulin lispro   1-3 Units Subcutaneous QHS    insulin lispro  1-5 Units Subcutaneous TID AC    insulin lispro  3 Units Subcutaneous Once    piperacillin-tazobactam  4.5 g Intravenous Q8H    vancomycin   Intravenous See Admin Instructions     PRN meds given in the past 48 hours: Percocet     Intake History: Reports good appetite PTA. Mentioned having a BG of 50 mg/dL and consumed 2 cheeseburgers and a bunch of other food but BG only went up to 65 mg/dL so he knows his pancrease is working. Takes Cinnamon and herbs at home.     Orders Placed This Encounter      Diet consistent carbohydrate    Orders Placed This Encounter   Procedures    Diet consistent carbohydrate     Food intake: Stated that appetite remains stable. Observed 100% of breakfast consumed this AM.     Allergies   Allergen Reactions    Molds & Smuts      difficulity breathing       Nutrition Focused Physical Exam:  Head: no overt s/s subcutaneous muscle or fat loss  Upper Body - no overt s/s subcutaneous muscle or fat loss  Lower Body - no s/s subcutaneous fat or muscle loss  Edema - See above.    Skin - Warm, dry.   GI function - See above.      Learning Needs: Pt denies nutrition-related questions about the Consistent Carbohydrate diet. Stated that "he knows that he's not supposed to eat sugar."     Anthropometrics  Height: 190.5 cm (6\' 3" )  Weight: 100.7 kg (222 lb)  Weight Change: -1.18  IBW/kg (Calculated) Male: 89.13 kg  IBW/kg (Calculated) Male: 79.51 kg  BMI (calculated): 27.8      Nutrition Diagnosis:     Altered Nutrition Related lab values related to uncontrolled diabetes as evidenced by POC 217-342 mg/dL N82 hr and current NFAO1H of 8.7.      Intervention:  Goal: Maintain PO intake >75% of meals. BG levels <180 mg/dL.     Plan:   1) Continue Consistent Carbohydrate diet      2) Recommend further adjustment of insulin regimen for optimal glycemic control <180 mg/dL (BG of 086-578 mg/dL I69 hr)      Monitoring/Evaluation:   PO intake  Weights  GI  symptoms      Donetta Potts, RDN, LDN  Clinical Dietitian  [phone]

## 2020-08-23 ENCOUNTER — Inpatient Hospital Stay: Payer: BC Managed Care – PPO

## 2020-08-23 LAB — COMPREHENSIVE METABOLIC PANEL
ALT: 30 U/L (ref 0–55)
AST (SGOT): 18 U/L (ref 5–34)
Albumin/Globulin Ratio: 0.9 (ref 0.9–2.2)
Albumin: 3 g/dL — ABNORMAL LOW (ref 3.5–5.0)
Alkaline Phosphatase: 81 U/L (ref 38–106)
Anion Gap: 8 (ref 5.0–15.0)
BUN: 11 mg/dL (ref 9–28)
Bilirubin, Total: 0.7 mg/dL (ref 0.2–1.2)
CO2: 22 mEq/L (ref 22–29)
Calcium: 9 mg/dL (ref 8.5–10.5)
Chloride: 105 mEq/L (ref 100–111)
Creatinine: 0.9 mg/dL (ref 0.7–1.3)
Globulin: 3.3 g/dL (ref 2.0–3.6)
Glucose: 274 mg/dL — ABNORMAL HIGH (ref 70–100)
Potassium: 4.3 mEq/L (ref 3.5–5.1)
Protein, Total: 6.3 g/dL (ref 6.0–8.3)
Sodium: 135 mEq/L — ABNORMAL LOW (ref 136–145)

## 2020-08-23 LAB — MAGNESIUM: Magnesium: 1.9 mg/dL (ref 1.6–2.6)

## 2020-08-23 LAB — GLUCOSE WHOLE BLOOD - POCT
Whole Blood Glucose POCT: 247 mg/dL — ABNORMAL HIGH (ref 70–100)
Whole Blood Glucose POCT: 313 mg/dL — ABNORMAL HIGH (ref 70–100)
Whole Blood Glucose POCT: 274 mg/dL — ABNORMAL HIGH (ref 70–100)

## 2020-08-23 LAB — CBC
Absolute NRBC: 0 10*3/uL (ref 0.00–0.00)
Hematocrit: 40 % (ref 37.6–49.6)
Hgb: 13.3 g/dL (ref 12.5–17.1)
MCH: 28.7 pg (ref 25.1–33.5)
MCHC: 33.3 g/dL (ref 31.5–35.8)
MCV: 86.4 fL (ref 78.0–96.0)
MPV: 8.7 fL — ABNORMAL LOW (ref 8.9–12.5)
Nucleated RBC: 0 /100 WBC (ref 0.0–0.0)
Platelets: 231 10*3/uL (ref 142–346)
RBC: 4.63 10*6/uL (ref 4.20–5.90)
RDW: 12 % (ref 11–15)
WBC: 4.15 10*3/uL (ref 3.10–9.50)

## 2020-08-23 LAB — MRSA CULTURE
Culture MRSA Surveillance: NEGATIVE
Culture MRSA Surveillance: NEGATIVE

## 2020-08-23 LAB — GFR: EGFR: >60

## 2020-08-23 NOTE — Nursing Progress Note (Signed)
Pt continues to refuse BP meds or insulin despite attempts to educate. Consulting civil engineer and providers aware. Pt requested scissors or razor blade to "cut dead skin" off of toe. This RN explained that we cannot provide that for safety and infection control. MD, charge RN and AD aware.

## 2020-08-23 NOTE — Plan of Care (Signed)
Problem: Safety  Goal: Patient will be free from injury during hospitalization  Outcome: Progressing  Flowsheets (Taken 08/21/2020 2354)  Patient will be free from injury during hospitalization:   Assess patient's risk for falls and implement fall prevention plan of care per policy   Provide and maintain safe environment  Goal: Patient will be free from infection during hospitalization  Outcome: Progressing  Flowsheets (Taken 08/21/2020 2354)  Free from Infection during hospitalization:   Assess and monitor for signs and symptoms of infection   Monitor lab/diagnostic results   Monitor all insertion sites (i.e. indwelling lines, tubes, urinary catheters, and drains)     Problem: Pain  Goal: Pain at adequate level as identified by patient  Outcome: Progressing  Flowsheets (Taken 08/21/2020 2354)  Pain at adequate level as identified by patient:   Assess pain on admission, during daily assessment and/or before any "as needed" intervention(s)   Reassess pain within 30-60 minutes of any procedure/intervention, per Pain Assessment, Intervention, Reassessment (AIR) Cycle   Evaluate if patient comfort function goal is met     Problem: Compromised Tissue integrity  Goal: Damaged tissue is healing and protected  Outcome: Progressing  Flowsheets (Taken 08/22/2020 1020 by Octaviano Batty, RN)  Damaged tissue is healing and protected:   Monitor/assess Braden scale every shift   Provide wound care per wound care algorithm   Increase activity as tolerated/progressive mobility   Monitor patient's hygiene practices   Keep intact skin clean and dry
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
## 2020-08-23 NOTE — Progress Notes (Signed)
Upon reviewing patients blood sugar, it was noted that patients blood glucose was 329 this shift. Pt informed the need for insulin to manage/control blood sugar. However, pt refused and did not want any insulin administer at all. Hospitalists notified and spoke with patient. Patient still adamant on not receiving insulin and prefer to use herbal supplement to manage his blood sugar over insulin. Patient also requested to apply washcloths over wound vs applying a gauze dressing over wound.

## 2020-08-23 NOTE — Progress Notes (Signed)
[facility]  HOSPITALIST  PROGRESS NOTE      Patient: Daniel Hebert  Date: 08/23/2020   LOS: 2 Days  Admission Date: 08/21/2020   MRN: [id number]  Attending: Drue Dun, MD       ASSESSMENT/PLAN     Daniel Hebert is a 60 y.o. male admitted with below       Osteomyelitis and Diabetic ulcer right second toe, cellulitis right foot, Charcot osteoarthropathy midfoot RT, Diabetic Foot ulcer R 2nd Toe:   - Appreciate Podiatry, ID, and Wound Care.   -Partial amputation of the second toe advised however patient refusing any surgery and only wants IV antibiotics.  States he will only stay for couple more days then he will leave and he would like oral antibiotics.  Explained to the patient that its not gone and he will now awake, even with long courses of antibiotic he might not heal.  Explained to him that his best option is to have partial resection.  He stated he would like to think about it.  -Follow-up arterial duplex  Uncontrolled DM: Refuses to take medications. Reports he takes herbs. A1C 8.7  Uncontrolled HTN: Refuses to take medications   Concern for psychiatric disorder: On my evaluation, although patient seems to have some fixed believes but he seems to understand his disease process and he seems to understand different options that he has and he seems to understand possible consequences.  I believe patient has capacity to make his own decision although he is making decision that could not be his best interest.  He is agreeing to using the IV antibiotics for now and he is willing to think about the amputation.  Discussed with Dr. Elpidio Anis who evaluated the patient and he agrees that patient has capacity.  Although it is documented that he has history of meningioma but I believe his behavior and symptoms are unrelated to a primary CNS defect and likely he has some underlying adjustment versus personality disorder    Also apparently he left his dog in the car in a hot summer day when he went to the  ED.    Patient Lines/Drains/Airways Status       Active PICC Line / CVC Line / PIV Line / Drain / Airway / Intraosseous Line / Epidural Line / ART Line / Line / Wound / Pressure Ulcer / NG/OG Tube       Name Placement date Placement time Site Days    Peripheral IV 08/21/20 20 G Standard Left Antecubital 08/21/20  1730  Antecubital  1                             Care Plan discussed with nursing, consultants, case manager.       SUBJECTIVE     Patient denied chest pain or shortness of breath or abdominal pain.  He was going on about how insulin made him gain weight and he did not want to be on it and he uses herbal stuff including cinnamon to help him with his diabetes.  He keeps talking about going to his car.  Denies any suicidal homicidal ideation.    MEDICATIONS     Current Facility-Administered Medications   Medication Dose Route Frequency    amLODIPine  5 mg Oral QPM    cadexomer iodine   Topical Daily    heparin (porcine)  5,000 Units Subcutaneous Q12H SCH    insulin glargine  20 Units  Subcutaneous QHS    insulin lispro  1-3 Units Subcutaneous QHS    insulin lispro  1-5 Units Subcutaneous TID AC    insulin lispro  3 Units Subcutaneous Once    piperacillin-tazobactam  4.5 g Intravenous Q8H    vancomycin  1,250 mg Intravenous Q12H    vancomycin   Intravenous See Admin Instructions       ROS     Remainder of 10 point ROS as above or otherwise negative    PHYSICAL EXAM     Vitals:    08/23/20 1111   BP: (!) 150/98   Pulse: 90   Resp: 16   Temp: 98.1 F (36.7 C)   SpO2: 99%       Temperature: Temp  Min: 97.3 F (36.3 C)  Max: 98.1 F (36.7 C)  Pulse: Pulse  Min: 68  Max: 90  Respiratory: Resp  Min: 16  Max: 18  Non-Invasive BP: BP  Min: 150/98  Max: 172/98  Pulse Oximetry SpO2  Min: 95 %  Max: 99 %    Intake and Output Summary (Last 24 hours) at Date Time  No intake or output data in the 24 hours ending 08/23/20 1619      GEN APPEARANCE: Alert and oriented   HEENT: PERLA; EOMI; Conjunctiva Clear  NECK: Supple; No  bruits  CVS: RRR, S1, S2; No M/G/R  LUNGS: CTAB; No Wheezes; No Rhonchi: No rales  Vascular: Dorsalis pedis pulses are palpable bilaterally though faint  ABD: Soft; No TTP; + Normoactive BS  EXT: Right foot erythema, swelling, ulcer right 2nd toe, no odors, no discharge  SKIN: No rash or Lesions  NEURO: CN 2-12 intact; No Focal neurological deficits.  Gait was also assessed and it is intact.  Psych: Somewhat grandiose, cooperative.    LABS     Recent Labs   Lab 08/23/20  0618 08/22/20  0609 08/21/20  1736   WBC 4.15 5.08 5.20   RBC 4.63 4.58 4.92   Hgb 13.3 13.1 14.5   Hematocrit 40.0 39.5 42.3   MCV 86.4 86.2 86.0   Platelets 231 219 236         Recent Labs   Lab 08/23/20  0618 08/22/20  0609 08/21/20  1736   Sodium 135* 136 136   Potassium 4.3 3.8 4.0   Chloride 105 104 100   CO2 22 22 25    BUN 11 13 21    Creatinine 0.9 0.8 1.1   Glucose 274* 217* 301*   Calcium 9.0 9.0 9.9   Magnesium 1.9 1.9  --          Recent Labs   Lab 08/23/20  0618 08/21/20  1736   ALT 30 40   AST (SGOT) 18 25   Bilirubin, Total 0.7 0.4   Albumin 3.0* 3.5   Alkaline Phosphatase 81 99               Recent Labs   Lab 08/22/20  0609   PT INR 1.3*   PT 15.6*   PTT 27         Microbiology Results (last 15 days)       Procedure Component Value Units Date/Time    Culture + Gram Azzie Glatter Wound [[id number]]     Order Status: Sent Specimen: Wound from Abrasion     MRSA culture [[id number]] Collected: 08/22/20 2133    Order Status: Sent Specimen: Culturette from Nasal Swab Updated: 08/23/20 0014    MRSA culture [[id number]] Collected:  08/22/20 2133    Order Status: Sent Specimen: Culturette from Throat Updated: 08/23/20 0014    Culture Blood Aerobic and Anaerobic [[id number]] Collected: 08/21/20 1809    Order Status: Completed Specimen: Arm from Blood, Venipuncture Updated: 08/23/20 0121    Narrative:      ORDER#: [id number]                                    ORDERED BY: Kelby Aline  SOURCE: Blood, Venipuncture Arm                      COLLECTED:   08/21/20 18:09  ANTIBIOTICS AT COLL.:                                RECEIVED :  08/22/20 00:57  Culture Blood Aerobic and Anaerobic        PRELIM      08/23/20 01:21  08/23/20   No Growth after 1 day/s of incubation.      Culture Blood Aerobic and Anaerobic [[id number]] Collected: 08/21/20 1736    Order Status: Completed Specimen: Arm from Blood, Venipuncture Updated: 08/23/20 0121    Narrative:      ORDER#: [id number]                                    ORDERED BY: Kelby Aline  SOURCE: Blood, Venipuncture Arm                      COLLECTED:  08/21/20 17:36  ANTIBIOTICS AT COLL.:                                RECEIVED :  08/22/20 00:57  Culture Blood Aerobic and Anaerobic        PRELIM      08/23/20 01:21  08/23/20   No Growth after 1 day/s of incubation.               RADIOLOGY     Radiological Procedure personally reviewed and concur with radiologist reports unless stated otherwise.    MRI Foot Right W WO Contrast   Final Result      1.  Osseous erosion and fragmentation of the second toe distal phalanx   with abnormal marrow edema in the second toe middle and distal   phalanges, compatible with osteomyelitis.      2.  Degenerative changes of the midfoot with adjacent osseous erosions   and extensive marrow edema/enhancement. These findings are likely due to   Charcot arthropathy, although superimposed infection is in region is   difficult to exclude.      3.  Diffuse subcutaneous edema and mild enhancement throughout the foot,   especially in the second toe, compatible with cellulitis. No discrete   abscess or drainable fluid collection is identified.      These urgent results were discussed with and acknowledged by Burnett Kanaris, DO, on 08/22/2020 at 2:50 PM.      Carla Drape, MD    08/22/2020 2:50 PM      Foot Right AP Lateral And Oblique   Final Result         1.  Abnormal lucency,  erosion, and fragmentation of the second toe   distal phalanx, most likely representing osteomyelitis with superimposed    pathologic fracture. There is pronounced soft tissue swelling of the   second toe and dorsal foot.      2.  Abnormal lateral subluxation of the second TMT joint and, to a   slightly lesser extent, third and fourth TMT joints, concerning for   Lisfranc ligamentous injury. There are associated degenerative changes   and ossification along the dorsal midfoot, which may be reflective of   developing Charcot arthropathy.      3.  NOTIFICATION: The critical findings of this study were discussed   with, and acknowledged by, Dr. Kelby Aline via telephone at 6:53 PM on   08/21/2020.      Vassie Moment, MD    08/21/2020 6:54 PM      US Arterial / Graft Duplex Doppler Lower Extremity Bilateral Complete    (Results Pending)       Signed,  Drue Dun, MD  4:19 PM 08/23/2020

## 2020-08-23 NOTE — Plan of Care (Signed)
Problem: Diabetes: Glucose Imbalance  Goal: Blood glucose stable at established goal  Outcome: Progressing  Flowsheets (Taken 08/22/2020 1020)  Blood glucose stable at established goal:   Monitor lab values   Ensure patient/family has adequate teaching materials   Ensure adequate hydration   Ensure appropriate consults are obtained (Nutrition, Diabetes Education, and Case Management)   Assess for hypoglycemia /hyperglycemia   Monitor/assess vital signs   Ensure appropriate diet and assess tolerance   Include patient/family in decisions related to nutrition/dietary selections   Monitor intake and output.  Notify LIP if urine output is < 30 mL/hour.     Problem: Compromised Tissue integrity  Goal: Damaged tissue is healing and protected  Outcome: Progressing  Flowsheets (Taken 08/22/2020 1020)  Damaged tissue is healing and protected:   Monitor/assess Braden scale every shift   Provide wound care per wound care algorithm   Increase activity as tolerated/progressive mobility   Monitor patient's hygiene practices   Keep intact skin clean and dry  Goal: Nutritional status is improving  Outcome: Progressing  Flowsheets (Taken 08/22/2020 1020)  Nutritional status is improving:   Allow adequate time for meals   Encourage patient to take dietary supplement(s) as ordered   Include patient/patient care companion in decisions related to nutrition   Collaborate with Clinical Nutritionist     Problem: Psychosocial and Spiritual Needs  Goal: Demonstrates ability to cope with hospitalization/illness  Outcome: Progressing  Flowsheets (Taken 08/22/2020 1020)  Demonstrates ability to cope with hospitalizations/illness:   Encourage verbalization of feelings/concerns/expectations   Provide quiet environment   Include patient/ patient care companion in decisions   Reinforce positive adaptation of new coping behaviors   Assist patient to identify own strengths and abilities     Problem: Discharge Barriers  Goal: Patient will be discharged  home or other facility with appropriate resources  Outcome: Progressing  Flowsheets (Taken 08/22/2020 1020)  Discharge to home or other facility with appropriate resources:   Provide appropriate patient education   Initiate discharge planning

## 2020-08-23 NOTE — Plan of Care (Signed)
BRIEF ID NOTE    Spoke w Nurse Harriett Sine  Afebrile, BP elevated  Refusing medications for hyperglycemia or elevated BP    Seen by podiatry today & plan for amputation was reiterated but pt still declining amputation    Per nurse tolerating ABX  /V, fevers, chills, diarrhea or rashes.    Per nurse patient continuing to use his herbal ointment over the toe.  Per the nurse patient agrees that 1 week of IV ABX & his herbal ointment will cure his foot.  Per the nurse patient is refusing home infusion (in any case, he is not a good candidate for a PICC line and home IV ABX)    Pt currently awaiting second opinion from hospitalist of the day.    Remainder of plan as per 7/20 note.    [place], MD

## 2020-08-23 NOTE — Progress Notes (Addendum)
Podiatric Surgery Daily Progress Note   Date/Time: 08/23/20 10:15 AM  Hospital Day: 3      ASSESSMENT & PLAN OF CARE:   Daniel Hebert is a 60 y.o. male     Osteomyelitis 2nd toe RT foot  Cellulitis RT foot  Charcot osteoarthropathy midfoot RT  Diabetes with neuropathy  - Patient has acute infection RT foot involving Osteomyelitis distal 2nd toe and cellulitis forefoot  - Xrays RT foot notes no soft tissue gas, but this soft tissue swelling 2nd toe and osteolytic changes to distal phalanx.  There is lateral subluxation of tarsometatarsal joints suggestive of Charcot osteoarthropathy  - MRI RT foot positive for Osteomyelitis distal and middle phalanx 2nd toe; no deep abscess forefoot   - I rediscussed pathology and treatment options with Patient.  Informed Patient that BEST Treatment option would be surgical cure of Osteomyelitis involving Partial 2nd toe Amputation.  Patient is still REFUSING surgical care RT foot.  Alternative option would be IV abx for 6-8 weeks, but it wouldn't eliminate infection to 2nd toe RT foot.  Also Patient may not be a suitable candidate for PICC line.    - Patient still believes that couple days of IV abx and possibility of receiving Hyperbaric oxygen (HBO) and his concoction of herbs will cure infection RT foot.  I reiterated to Patient that he more than likely is not a candidate for HBO and having course of 6-8 weeks of IV abx isn't a guarantee to cure Osteomyelitis within 2nd toe.  Informed Patient that to do surgery now would involve Partial 2nd toe amputation RT foot.  If he delays surgery by few weeks or more, he may require a Total 2nd toe amputation.   Patient is willing to take his chances on nonsurgical care for RT foot.  - Patient would do best with being d/ced with PO abx and he f/u with [facility] for HBO consultation.  Patient needs to explore his own path of care before realizing that what current specialists are discussing with him is viable and recommended  treatment options that is in line with standard of care.  - Patient to continue IV abx while inhouse  - Pain meds prn  - WBAT RT foot    SUBJECTIVE:   AVSS  NO SOB or Chest pain  No N/V/F/C  mild pain RT foot    OBJECTIVE:   Vital Signs   Current Vitals: Blood pressure (!) 156/95, pulse 71, temperature 97.5 F (36.4 C), temperature source Oral, resp. rate 16, height 1.905 m (6\' 3" ), weight 100.7 kg (222 lb), SpO2 97 %.  Prior 24 hours: Temp:  [97.3 F (36.3 C)-97.7 F (36.5 C)] 97.5 F (36.4 C)  Heart Rate:  [68-95] 71  Resp Rate:  [16-18] 16  BP: (155-174)/(84-107) 156/95    Physical Examination:  Vasc exam: palpable pedal pulses RT foot  Neuro exam: decreased protective threshold RT foot  Derm exam: (+) erythema forefoot RT but concentrated around 2nd toe/2nd MPJ; 0.5cm x 0.5cm x 0.3cm full thickness fibrotic ulcer distal tuft 2nd toe RT foot with probe deep to bone; no purulence ulcer 2nd toe RT foot; no malodor ulcer 2nd toe RT foot  Ortho exam: sausage digit swelling 2nd toe RT foot    LABORATORY RESULTS:   Hematology (most recent):  Lab Results   Component Value Date/Time    WBC 4.15 08/23/2020 06:18 AM    HCT 40.0 08/23/2020 06:18 AM    HGB 13.3 08/23/2020 06:18  AM   Chemistry (most recent):  Lab Results   Component Value Date/Time    NA 135 (L) 08/23/2020 06:18 AM    K 4.3 08/23/2020 06:18 AM    CL 105 08/23/2020 06:18 AM    CO2 22 08/23/2020 06:18 AM    BUN 11 08/23/2020 06:18 AM    CREAT 0.9 08/23/2020 06:18 AM    GLU 274 (H) 08/23/2020 06:18 AM    CA 9.0 08/23/2020 06:18 AM    MG 1.9 08/23/2020 06:18 AM   Coagulation Profile (most recent):  Lab Results   Component Value Date/Time    PT 15.6 (H) 08/22/2020 06:09 AM    PTT 27 08/22/2020 06:09 AM    INR 1.3 (H) 08/22/2020 06:09 AM   Hepatic Profile (most recent):  Lab Results   Component Value Date/Time    AST 18 08/23/2020 06:18 AM    ALT 30 08/23/2020 06:18 AM   Nutrition Profile (last two values):  Lab Results   Component Value Date/Time    ALB 3.0  (L) 08/23/2020 06:18 AM    ALB 3.5 08/21/2020 05:36 PM       Pathology  Specimens (From admission, onward)      None            Microbiology    Blood Cx: NGTD    RADIOLOGY:   MRI RIGHT FOOT    HISTORY: Right foot infection. Evaluate for osteomyelitis.     COMPARISON: Radiographs from 08/21/2020.     TECHNIQUE: MRI of the right forefoot performed on a 1.5T scanner without  and with 10 mL of Gadavist intravenous contrast.     FINDINGS:  Bone: Diffuse soft tissue swelling, edema, enhancement are seen  throughout the second toe with ulceration at the tip of the toe.  Underlying osseous erosion and bony fragmentation are seen affecting the  second toe distal phalanx. Abnormal confluent marrow edema and  enhancement are seen throughout the remaining second toe distal phalanx  and the middle phalanx, suggestive of osteomyelitis. There are  pronounced degenerative changes of the midfoot, including joint space  narrowing, subcortical sclerosis and cysts, and osseous erosions.  Extensive abnormal marrow edema and enhancement are seen throughout the  tarsal bones and metatarsals. These findings are likely due to Charcot  arthropathy, although superimposed infection in this region is difficult  to exclude. No other fracture is identified.     Soft Tissues: Diffuse subcutaneous edema and mild enhancement are seen  throughout the foot. Diffuse muscle edema, enhancement, and mild atrophy  in the foot are likely related to underlying diabetic neuropathy. No  discrete abscess or drainable fluid collection is identified.     IMPRESSION:     1.  Osseous erosion and fragmentation of the second toe distal phalanx  with abnormal marrow edema in the second toe middle and distal  phalanges, compatible with osteomyelitis.     2.  Degenerative changes of the midfoot with adjacent osseous erosions  and extensive marrow edema/enhancement. These findings are likely due to  Charcot arthropathy, although superimposed infection is in region  is  difficult to exclude.     3.  Diffuse subcutaneous edema and mild enhancement throughout the foot,  especially in the second toe, compatible with cellulitis. No discrete  abscess or drainable fluid collection is identified.     These urgent results were discussed with and acknowledged by Burnett Kanaris, DO, on 08/22/2020 at 2:50 PM.     Carla Drape, MD   08/22/2020  2:50 PM    CHART DOCUMENTED PROBLEM LIST:     Patient Active Problem List   Diagnosis    Cellulitis of left lower extremity       Signed by:  Benita Stabile, FACFAS  Podiatric Surgery   [facility]  Pager # [phone]

## 2020-08-23 NOTE — Consults (Signed)
Psychiatry Consult Progress Note    Patient name:  Daniel Hebert, Daniel Hebert  Date of admission:  08/21/2020  Date of birth:  07/12/1960                       Date of consultation:  08/23/2020  Age:  60 y.o.                                     Attending/Referring Physician:  Drue Dun, MD  MRN:  [id number]                                   Consulting Physician:  Violeta Gelinas, MD  CSN:  [id number]                                    Guardian/POA: Friend      Reason for Admission    Foot pain, cellulitis of lower extremity,    Active Hospital Problems    Diagnosis    Cellulitis of left lower extremity       Reason for Initial Consultation    Evaluation for capacity, refusing medication management and care, patient is found to have denies in his room    I. History     Interval History  Beau Ramsburg is a 60 y.o. 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 Right Foot OM, DM Type II, HTN, HLD, Hx. Meningioma, GERD who presented with Right Foot Pain.     Patient reports for the past 4-5 days he has had progressive redness, pain, and swelling of his right foot. He has had chronic foot ulcer and recurrent infections in the past and has been evaluated by many different Podiatrists and Ortho Ankle/Foot physicians recently. He had MRI recently which showed chronic stable changes related to the foot and no acute OM or septic arthritis. The patient is determined not to undergo any amputations and has poor opinion of Podiatry as a field stating that they have mismanaged his condition. He was seen by Foot/Ankle Ortho Dr. Mosetta Putt who recommended coming to the hospital for IV Abx and Podiatry evaluation. In the ED, hemodynamically stable, started on Vanc+Zosyn and admitted for further management. On my discussion with the patient, he was very resistant to having Podiatry consult, but on further discussion, has agreed to evaluation by them in the AM.  Patient reportedly drove to this  area from [place] with his dog    Patient reportedly drove from [place] to this area with his dog in the car left the dog in the car and heart son of couple days while he is in the hospital.  The dog is subsequently rescued and currently in the pond.  Police charges are pending     MV:HQIONG smoking tobacco, drinking alcohol, or using illicit drugs.  His ex-wife lives in his    A psychiatric consult was initiated as patient is refusing treatment angry agitated paranoid delusional agitated demanding argumentative and a capacity evaluation is recommended.  Patient is willing to take IV antibiotics  Does not want any p.o. medications for diabetes mellitus, hypertension, any other issues    Medical history is significant for hypertensive cardiovascular disorder, right lower  extremity cellulitis, infection, osteomyelitis, diabetes mellitus, hypercholesterolemia, history of brain tumor /meningioma, GERD, poor self-care    Chief Complaint:   Mood swings impaired insight and judgment, agitation paranoia severe thought disorder impaired insight and judgment unable to care for himself  Additional History and Collateral Information:   No new additional history and collateral information could be obtained at this time  Medical Review of Systems  Poorly cooperative  All other pertinent Review of Systems negative.    Allergies   Allergen Reactions    Molds & Smuts      difficulity breathing     Current Facility-Administered Medications   Medication Dose Route Frequency    amLODIPine  5 mg Oral QPM    cadexomer iodine   Topical Daily    heparin (porcine)  5,000 Units Subcutaneous Q12H SCH    insulin glargine  20 Units Subcutaneous QHS    insulin lispro  1-3 Units Subcutaneous QHS    insulin lispro  1-5 Units Subcutaneous TID AC    insulin lispro  3 Units Subcutaneous Once    piperacillin-tazobactam  4.5 g Intravenous Q8H    vancomycin  1,250 mg Intravenous Q12H    vancomycin   Intravenous See Admin Instructions          II. Examination   Vital signs reviewed:   Blood pressure (!) 150/98, pulse 90, temperature 98.1 F (36.7 C), temperature source Oral, resp. rate 16, height 1.905 m (6\' 3" ), weight 100.7 kg (222 lb), SpO2 99 %.           The physical exam performed by the medical team reviewed.    Laboratory Data and Diagnostic Studies  Lab Results last 48 Hours       Procedure Component Value Units Date/Time    Glucose Whole Blood - POCT [[id number]]  (Abnormal) Collected: 08/23/20 1113     Updated: 08/23/20 1118     Whole Blood Glucose POCT 313 mg/dL     Magnesium [[id number]] Collected: 08/23/20 0618    Specimen: Blood Updated: 08/23/20 0811     Magnesium 1.9 mg/dL     Comprehensive metabolic panel [[id number]]  (Abnormal) Collected: 08/23/20 0618    Specimen: Blood Updated: 08/23/20 0811     Glucose 274 mg/dL      BUN 11 mg/dL      Creatinine 0.9 mg/dL      Calcium 9.0 mg/dL      Sodium 784 mEq/L      Potassium 4.3 mEq/L      Chloride 105 mEq/L      CO2 22 mEq/L      Anion Gap 8.0     Protein, Total 6.3 g/dL      Albumin 3.0 g/dL      AST (SGOT) 18 U/L      ALT 30 U/L      Alkaline Phosphatase 81 U/L      Bilirubin, Total 0.7 mg/dL      Globulin 3.3 g/dL      Albumin/Globulin Ratio 0.9    GFR [[id number]] Collected: 08/23/20 0618     Updated: 08/23/20 0811     EGFR >60.0       Glucose Whole Blood - POCT [[id number]]  (Abnormal) Collected: 08/23/20 0729     Updated: 08/23/20 0744     Whole Blood Glucose POCT 247 mg/dL     CBC without differential [[id number]]  (Abnormal) Collected: 08/23/20 0618    Specimen: Blood Updated: 08/23/20 0651     WBC 4.15 x10  3/uL      Hgb 13.3 g/dL      Hematocrit 16.1 %      Platelets 231 x10 3/uL      RBC 4.63 x10 6/uL      MCV 86.4 fL      MCH 28.7 pg      MCHC 33.3 g/dL      RDW 12 %      MPV 8.7 fL      Nucleated RBC 0.0 /100 WBC      Absolute NRBC 0.00 x10 3/uL     Culture Blood Aerobic and Anaerobic [[id number]] Collected: 08/21/20 1809    Specimen: Arm from Blood, Venipuncture Updated:  08/23/20 0121    Narrative:      ORDER#: [id number]                                    ORDERED BY: Kelby Aline  SOURCE: Blood, Venipuncture Arm                      COLLECTED:  08/21/20 18:09  ANTIBIOTICS AT COLL.:                                RECEIVED :  08/22/20 00:57  Culture Blood Aerobic and Anaerobic        PRELIM      08/23/20 01:21  08/23/20   No Growth after 1 day/s of incubation.      Culture Blood Aerobic and Anaerobic [[id number]] Collected: 08/21/20 1736    Specimen: Arm from Blood, Venipuncture Updated: 08/23/20 0121    Narrative:      ORDER#: [id number]                                    ORDERED BY: Kelby Aline  SOURCE: Blood, Venipuncture Arm                      COLLECTED:  08/21/20 17:36  ANTIBIOTICS AT COLL.:                                RECEIVED :  08/22/20 00:57  Culture Blood Aerobic and Anaerobic        PRELIM      08/23/20 01:21  08/23/20   No Growth after 1 day/s of incubation.      MRSA culture [[id number]] Collected: 08/22/20 2133    Specimen: Culturette from Nasal Swab Updated: 08/23/20 0014    MRSA culture [[id number]] Collected: 08/22/20 2133    Specimen: Culturette from Throat Updated: 08/23/20 0014    Glucose Whole Blood - POCT [[id number]]  (Abnormal) Collected: 08/22/20 2107     Updated: 08/22/20 2122     Whole Blood Glucose POCT 329 mg/dL     Glucose Whole Blood - POCT [[id number]]  (Abnormal) Collected: 08/22/20 1527     Updated: 08/22/20 1543     Whole Blood Glucose POCT 303 mg/dL     Hemoglobin E3P [[id number]]  (Abnormal) Collected: 08/22/20 0609    Specimen: Blood Updated: 08/22/20 0945     Hemoglobin A1C 8.7 %      Average Estimated Glucose 203.0 mg/dL     Glucose Whole Blood - POCT [  [id number]]  (Abnormal) Collected: 08/22/20 0749     Updated: 08/22/20 0818     Whole Blood Glucose POCT 224 mg/dL     Basic Metabolic Panel [[id number]]  (Abnormal) Collected: 08/22/20 0609    Specimen: Blood Updated: 08/22/20 0749     Glucose 217 mg/dL      BUN 13 mg/dL      Creatinine 0.8 mg/dL       Calcium 9.0 mg/dL      Sodium 301 mEq/L      Potassium 3.8 mEq/L      Chloride 104 mEq/L      CO2 22 mEq/L      Anion Gap 10.0    Magnesium [[id number]] Collected: 08/22/20 0609    Specimen: Blood Updated: 08/22/20 0749     Magnesium 1.9 mg/dL     GFR [[id number]] Collected: 08/22/20 0609     Updated: 08/22/20 0749     EGFR >60.0       CBC without differential [[id number]] Collected: 08/22/20 0609    Specimen: Blood Updated: 08/22/20 0722     WBC 5.08 x10 3/uL      Hgb 13.1 g/dL      Hematocrit 83.1 %      Platelets 219 x10 3/uL      RBC 4.58 x10 6/uL      MCV 86.2 fL      MCH 28.6 pg      MCHC 33.2 g/dL      RDW 13 %      MPV 8.9 fL      Nucleated RBC 0.0 /100 WBC      Absolute NRBC 0.00 x10 3/uL     PT/APTT [[id number]]  (Abnormal) Collected: 08/22/20 0609     Updated: 08/22/20 0720     PT 15.6 sec      PT INR 1.3     PTT 27 sec     Glucose Whole Blood - POCT [[id number]]  (Abnormal) Collected: 08/21/20 2127     Updated: 08/21/20 2143     Whole Blood Glucose POCT 342 mg/dL     Sedimentation rate (ESR) [[id number]]  (Abnormal) Collected: 08/21/20 1736    Specimen: Blood Updated: 08/21/20 1802     Sed Rate 38 mm/Hr     Comprehensive metabolic panel [[id number]]  (Abnormal) Collected: 08/21/20 1736    Specimen: Blood Updated: 08/21/20 1801     Glucose 301 mg/dL      BUN 21 mg/dL      Creatinine 1.1 mg/dL      Sodium 371 mEq/L      Potassium 4.0 mEq/L      Chloride 100 mEq/L      CO2 25 mEq/L      Calcium 9.9 mg/dL      Protein, Total 7.2 g/dL      Albumin 3.5 g/dL      AST (SGOT) 25 U/L      ALT 40 U/L      Alkaline Phosphatase 99 U/L      Bilirubin, Total 0.4 mg/dL      Globulin 3.7 g/dL      Albumin/Globulin Ratio 0.9     Anion Gap 11.0    GFR [[id number]] Collected: 08/21/20 1736     Updated: 08/21/20 1801     EGFR >60.0       Lactic Acid [[id number]] Collected: 08/21/20 1735    Specimen: Blood Updated: 08/21/20 1753     Lactic Acid 1.4 mmol/L     Narrative:  Cancel second order for specimen if the initial level is  less  than 2.57mEq/L    CBC and differential [[id number]]  (Abnormal) Collected: 08/21/20 1736    Specimen: Blood Updated: 08/21/20 1746     WBC 5.20 x10 3/uL      Hgb 14.5 g/dL      Hematocrit 40.9 %      Platelets 236 x10 3/uL      RBC 4.92 x10 6/uL      MCV 86.0 fL      MCH 29.5 pg      MCHC 34.3 g/dL      RDW 12 %      MPV 8.7 fL      Neutrophils 64.9 %      Lymphocytes Automated 18.1 %      Monocytes 13.7 %      Eosinophils Automated 1.9 %      Basophils Automated 0.8 %      Immature Granulocytes 0.6 %      Nucleated RBC 0.0 /100 WBC      Neutrophils Absolute 3.38 x10 3/uL      Lymphocytes Absolute Automated 0.94 x10 3/uL      Monocytes Absolute Automated 0.71 x10 3/uL      Eosinophils Absolute Automated 0.10 x10 3/uL      Basophils Absolute Automated 0.04 x10 3/uL      Immature Granulocytes Absolute 0.03 x10 3/uL      Absolute NRBC 0.00 x10 3/uL           Last EKG Result       None          No results found for any visits on 08/21/20.    III. Assessment and Plan (Medical Decision Making)       Louie Flenner is a 60 y.o. 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 Right Foot OM, DM Type II, HTN, HLD, Hx. Meningioma, GERD who presented with Right Foot Pain.     Patient reports for the past 4-5 days he has had progressive redness, pain, and swelling of his right foot. He has had chronic foot ulcer and recurrent infections in the past and has been evaluated by many different Podiatrists and Ortho Ankle/Foot physicians recently. He had MRI recently which showed chronic stable changes related to the foot and no acute OM or septic arthritis. The patient is determined not to undergo any amputations and has poor opinion of Podiatry as a field stating that they have mismanaged his condition. He was seen by Foot/Ankle Ortho Dr. Mosetta Putt who recommended coming to the hospital for IV Abx and Podiatry evaluation. In the ED, hemodynamically stable, started on Vanc+Zosyn and admitted  for further management. On my discussion with the patient, he was very resistant to having Podiatry consult, but on further discussion, has agreed to evaluation by them in the AM.    Patient reportedly drove from [place] to this area with his dog in the car left the dog in the car and heart son of couple days while he is in the hospital.  The dog is subsequently rescued and currently in the pond.  Police charges are pending     WJ:[id number] smoking tobacco, drinking alcohol, or using illicit drugs.  His ex-wife lives in his    A psychiatric consult was initiated as patient is refusing treatment angry agitated paranoid delusional agitated demanding argumentative and a capacity evaluation is recommended.  Patient is  willing to take IV antibiotics  Does not want any p.o. medications for diabetes mellitus, hypertension, any other issues    Medical history is significant for hypertensive cardiovascular disorder, right lower extremity cellulitis, infection, osteomyelitis, diabetes mellitus, hypercholesterolemia, history of brain tumor /meningioma, GERD, poor self-care  MRI of the foot indicates     IMPRESSION:  1.  Osseous erosion and fragmentation of the second toe distal phalanx  with abnormal marrow edema in the second toe middle and distal  phalanges, compatible with osteomyelitis.  2.  Degenerative changes of the midfoot with adjacent osseous erosions  and extensive marrow edema/enhancement. 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  3.  Diffuse subcutaneous edema and mild enhancement throughout the foot,  especially in the second toe, compatible with cellulitis. No discrete  abscess or drainable fluid collection is identified.     These urgent results were discussed with and acknowledged by Burnett Kanaris, DO, on 08/22/2020 at 2:50 PM.   Carla Drape, MD   08/22/2020 2:50 PM     Knives were discovered in his room and were removed by  the hospital staff    Well-developed single Caucasian male  Rapid pressured speech anxious angry irritable  Severe thought disorder was noted  Patient denies suicidal homicidal ideations cooperative contract for safety  Patient is not a suicidal risk not a homicidal risk at this time  Patient denies hearing voices denies suicidal homicidal ideations denies any dangerousness at this time  Patient is willing to take IV antibiotics however patient is refusing p.o. 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  Axis IV moderate  Axis V 20-25    Disposition Recommendation:     Medication management  Medical stabilization  Supportive care  Patient is cooperative and willing to take IV antibiotics at this time  However patient is refusing to take p.o. medication for hypertensive cardiovascular disorder or diabetes mellitus at this time, patient states that he does not believe in the p.o. medications and insulin  May discharge AMA when medically stable  If patient needs involuntary medication management may consider a medical TDO for involuntary treatment and medication management  Patient is not a suicidal risk not a homicidal risk at this time patient may be discharged when medically stable  Patient has a court order to appear in the court on July  27th as he left the dog in the car in [place]  Patient has no knives in his room at this time patient is stable safe contract for safety  If situation should change may consider TDO evaluation for inpatient psychiatric treatment?  Case management services and discharge plans recommended      Signed by: Violeta Gelinas, MD  08/23/2020  11:41 AM    *This note was generated by the Epic EMR system/ Dragon speech recognition and may contain inherent errors or omissions not  intended by the user. Grammatical errors, random word insertions, deletions, pronoun errors and incomplete sentences are occasional consequences of this technology due to software limitations. Not all errors are caught or corrected. If there are questions or concerns about the content of this note or information contained within the body of this dictation they should be addressed directly with the author for clarification

## 2020-08-23 NOTE — Progress Notes (Addendum)
Case Management Initial Assessment and Discharge Planning:        Situation Daniel Hebert y.o.,male  HospitalDay:2  Admitting DX: Cellulitis of left lower extremity [L03.116]    Lace Score:6   Background Patient home (legal address is in [place], [place] He is visiting his ex in [place] and had been staying at a hotel. He visits his physicians in [place] and [facility].  He has an advance directive in [place] and is revising it possibly to remove his niece and nephew, will want his best friend Alinda Money to make decisions for him if he could not.Will provide [place] advance directive  if desired.  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  Patient verbalized upset ment over issue of his dog that was removed from his car in the parking lot and is now in a pound. He was going to ask his ex to get the dog and care for it. He spoke of need for hyperbaric treatment, 'will refuse surgery, only believe in herbal medicine."  Patient Relations called to speak to patient re; his dog issue and multiple complaints.  DME: none  Home Health: none  Transportation: self  Out Patient: none     Assessment ER:CC: rt foot pain, recent bilateral arthroscopy. Dx as foot cellulitis  and Charcot foot and put on antibiotics. He reported that the antibiotics resulted in a low blood sugars, so he stopped his Insulin.He has seen Podiatrists in the past and recent MD at [facility] referred him to [facility].  PMH:  GERD, Malignant Neoplasm-Brain Tumor 2015, MRSA rt toes, DM  Discharge Barriers identified upon initial assessment: Possible need for home care services, location pending.   Recommendation   Possible post-hospitalization need based on patient's GOC and treatment plan:        Case Manager will continue to follow for discharge planning needs.           CM will continue to monitor patient's progression of care, d/c needs and possible barriers to discharge.      Dewaine Conger, RN BSN MPA CCM, ACM-RN  Clinical Case Manager II  [facility]  Case Management Department  Phone: [phone]  Fax:     [phone]    08/23/20 1707   Patient Type   Within 30 Days of Previous Admission? No   Healthcare Decisions   Interviewed: Patient   Orientation/Decision Making Abilities of Patient Alert and Oriented x3, able to make decisions   Advance Directive Patient has advance directive, copy not in chart  (Neice and nephew listed,he is updating when he returns to [employer].)   Healthcare Agent Appointed No   (RETIRED) Healthcare Agent's Name na   (RETIRED) Healthcare Agent's Phone Number na   Additional Emergency Contacts? Friend: Milus Mallick   Prior to admission   Prior level of function Independent with ADLs   Type of Residence Private residence   Home Layout Multi-level   Have running water, electricity, heat, etc? Yes   Living Arrangements Alone   How do you get to your MD appointments? self   How do you get your groceries? self   Who fixes your meals? self   Who does your laundry? self   Who picks up your prescriptions? self   Dressing Independent   Grooming Independent   Feeding Independent   Bathing Independent   Toileting Independent   DME Currently at Home Other (  Comment)  (none)   Name of Prior Assisted Living Facility na   Home Care/Community Services None   Prior SNF admission? (Detail) na   Prior Rehab admission? (Detail) na   Discharge Planning   Support Systems Friends/neighbors   Patient expects to be discharged to: home,either remain in [place] or return to [facility]   Anticipated [place] plan discussed with: Same as interviewed   Potential barriers to discharge: Decreased mobility   Mode of transportation: Private car (family member)   Does the patient have perscription coverage? Yes   Housing Stability   In the last 12 months, was there a time when you did not have a steady place to sleep or slept in a shelter (including now)? Y   Transportation Needs   In the  past 12 months, has lack of transportation kept you from medical appointments or from getting medications? no   In the past 12 months, has lack of transportation kept you from meetings, work, or from getting things needed for daily living? No   Consults/Providers   PT Evaluation Needed 1   OT Evalulation Needed 2   SLP Evaluation Needed 2   Correct PCP listed in Epic? No (comment)   Family and PCP   PCP on file was verified as the current PCP? Provider not on file   Important Message from Medicare Notice   Patient received 1st IMM Letter? n/a

## 2020-08-24 LAB — CBC AND DIFFERENTIAL
Absolute NRBC: 0 10*3/uL (ref 0.00–0.00)
Basophils Absolute Automated: 0.05 10*3/uL (ref 0.00–0.08)
Basophils Automated: 1 %
Eosinophils Absolute Automated: 0.37 10*3/uL (ref 0.00–0.44)
Eosinophils Automated: 7.5 %
Hematocrit: 40.2 % (ref 37.6–49.6)
Hgb: 14 g/dL (ref 12.5–17.1)
Immature Granulocytes Absolute: 0.04 10*3/uL (ref 0.00–0.07)
Immature Granulocytes: 0.8 %
Lymphocytes Absolute Automated: 1.44 10*3/uL (ref 0.42–3.22)
Lymphocytes Automated: 29.3 %
MCH: 30.2 pg (ref 25.1–33.5)
MCHC: 34.8 g/dL (ref 31.5–35.8)
MCV: 86.8 fL (ref 78.0–96.0)
MPV: 8.5 fL — ABNORMAL LOW (ref 8.9–12.5)
Monocytes Absolute Automated: 0.39 10*3/uL (ref 0.21–0.85)
Monocytes: 7.9 %
Neutrophils Absolute: 2.62 10*3/uL (ref 1.10–6.33)
Neutrophils: 53.5 %
Nucleated RBC: 0 /100 WBC (ref 0.0–0.0)
Platelets: 267 10*3/uL (ref 142–346)
RBC: 4.63 10*6/uL (ref 4.20–5.90)
RDW: 12 % (ref 11–15)
WBC: 4.91 10*3/uL (ref 3.10–9.50)

## 2020-08-24 LAB — BASIC METABOLIC PANEL
Anion Gap: 7 (ref 5.0–15.0)
BUN: 12 mg/dL (ref 9–28)
CO2: 25 mEq/L (ref 22–29)
Calcium: 9.3 mg/dL (ref 8.5–10.5)
Chloride: 104 mEq/L (ref 100–111)
Creatinine: 1 mg/dL (ref 0.7–1.3)
Glucose: 214 mg/dL — ABNORMAL HIGH (ref 70–100)
Potassium: 4.7 mEq/L (ref 3.5–5.1)
Sodium: 136 mEq/L (ref 136–145)

## 2020-08-24 LAB — HEPATIC FUNCTION PANEL
ALT: 53 U/L (ref 0–55)
AST (SGOT): 46 U/L — ABNORMAL HIGH (ref 5–34)
Albumin/Globulin Ratio: 0.9 (ref 0.9–2.2)
Albumin: 3.1 g/dL — ABNORMAL LOW (ref 3.5–5.0)
Alkaline Phosphatase: 82 U/L (ref 38–106)
Bilirubin Direct: 0.2 mg/dL (ref 0.0–0.5)
Bilirubin Indirect: 0.6 mg/dL (ref 0.2–1.0)
Bilirubin, Total: 0.8 mg/dL (ref 0.2–1.2)
Globulin: 3.4 g/dL (ref 2.0–3.6)
Protein, Total: 6.5 g/dL (ref 6.0–8.3)

## 2020-08-24 LAB — GLUCOSE WHOLE BLOOD - POCT
Whole Blood Glucose POCT: 223 mg/dL — ABNORMAL HIGH (ref 70–100)
Whole Blood Glucose POCT: 270 mg/dL — ABNORMAL HIGH (ref 70–100)
Whole Blood Glucose POCT: 289 mg/dL — ABNORMAL HIGH (ref 70–100)
Whole Blood Glucose POCT: 292 mg/dL — ABNORMAL HIGH (ref 70–100)

## 2020-08-24 LAB — VANCOMYCIN, TROUGH: Vancomycin Trough: 9.7 ug/mL — ABNORMAL LOW (ref 10.0–20.0)

## 2020-08-24 LAB — GFR: EGFR: >60

## 2020-08-24 LAB — MAGNESIUM: Magnesium: 1.9 mg/dL (ref 1.6–2.6)

## 2020-08-24 MED ORDER — AMLODIPINE BESYLATE 5 MG PO TABS
10.0000 mg | ORAL_TABLET | Freq: Every evening | ORAL | Status: DC
Start: 2020-08-24 — End: 2020-08-24
  Administered 2020-08-24: 10 mg via ORAL
  Filled 2020-08-24: qty 2

## 2020-08-24 MED ORDER — VANCOMYCIN HCL IN NACL 1.5-0.9 GM/500ML-% IV SOLN
1500.0000 mg | Freq: Two times a day (BID) | INTRAVENOUS | Status: DC
Start: 2020-08-24 — End: 2020-08-27
  Administered 2020-08-25 – 2020-08-27 (×6): 1500 mg via INTRAVENOUS
  Filled 2020-08-24 (×8): qty 500

## 2020-08-24 MED ORDER — AMLODIPINE BESYLATE 5 MG PO TABS
10.0000 mg | ORAL_TABLET | Freq: Every day | ORAL | Status: DC
Start: 2020-08-25 — End: 2020-09-03
  Administered 2020-08-25 – 2020-09-03 (×10): 10 mg via ORAL
  Filled 2020-08-24 (×10): qty 2

## 2020-08-24 NOTE — Plan of Care (Signed)
Problem: Pain  Goal: Pain at adequate level as identified by patient  Outcome: Progressing  Flowsheets (Taken 08/21/2020 2354 by Gabriela Eves, RN)  Pain at adequate level as identified by patient:   Assess pain on admission, during daily assessment and/or before any "as needed" intervention(s)   Reassess pain within 30-60 minutes of any procedure/intervention, per Pain Assessment, Intervention, Reassessment (AIR) Cycle   Evaluate if patient comfort function goal is met     Problem: Compromised Tissue integrity  Goal: Nutritional status is improving  Outcome: Progressing  Flowsheets (Taken 08/22/2020 1020 by Octaviano Batty, RN)  Nutritional status is improving:   Allow adequate time for meals   Encourage patient to take dietary supplement(s) as ordered   Include patient/patient care companion in decisions related to nutrition   Collaborate with Clinical Nutritionist

## 2020-08-24 NOTE — Plan of Care (Signed)
BRIEF ID NOTE    Spoke w Nurse Carollee Herter    Afebrile, BP elevated 190s systolic  Refusing medications for hyperglycemia or elevated BP & wants to use his own medications; and topical salve    Per nurse tolerating ABX - no N/V, fevers, chills, diarrhea or rashes.    Per the nurse 7/21 - patient agrees that 1 week of IV ABX & his herbal ointment will cure his foot.    Per the  7/21 nurse pt - refusing home infusion (in any case, he is not a good candidate for a PICC line and home IV ABX)    Remainder of plan as per 7/20 note.      [place], MD

## 2020-08-24 NOTE — Plan of Care (Signed)
No acute change this shift, no new complaints, pain managed with PRNs, received IV abx, ambulating independently, tolerating PO, voiding spontaneously.     BP and BGL both high but Pt refuses meds for them stating he has his own meds at home that medical staff won't let him use. Pt is managing toe wound independently with personal ointment, declining other wound care, leaving toe open to air and walking around barefoot in room. Education provided. Education refused.     Pt refusing care from specific staff members, stating "if you come in this room that's trespassing and I'll call the police and have you removed and escorted out."       Problem: Discharge Barriers  Goal: Patient will be discharged home or other facility with appropriate resources  08/24/2020 0612 by Kristine Linea, RN  Outcome: Not Progressing  08/24/2020 0604 by Kristine Linea, RN  Outcome: Progressing     Problem: Psychosocial and Spiritual Needs  Goal: Demonstrates ability to cope with hospitalization/illness  08/24/2020 0612 by Kristine Linea, RN  Outcome: Not Progressing  08/24/2020 0604 by Kristine Linea, RN  Outcome: Progressing     Problem: Compromised Tissue integrity  Goal: Damaged tissue is healing and protected  08/24/2020 0612 by Kristine Linea, RN  Outcome: Not Progressing  08/24/2020 0604 by Kristine Linea, RN  Outcome: Progressing  Goal: Nutritional status is improving  08/24/2020 0612 by Kristine Linea, RN  Outcome: Not Progressing  08/24/2020 0604 by Kristine Linea, RN  Outcome: Progressing     Problem: Diabetes: Glucose Imbalance  Goal: Blood glucose stable at established goal  08/24/2020 0612 by Kristine Linea, RN  Outcome: Not Progressing  08/24/2020 0604 by Kristine Linea, RN  Outcome: Progressing     Problem: Safety  Goal: Patient will be free from injury during hospitalization  08/24/2020 0612 by Kristine Linea, RN  Outcome: Progressing  08/24/2020 0604 by Kristine Linea, RN  Outcome:  Progressing  Goal: Patient will be free from infection during hospitalization  08/24/2020 0612 by Kristine Linea, RN  Outcome: Progressing  08/24/2020 0604 by Kristine Linea, RN  Outcome: Progressing     Problem: Pain  Goal: Pain at adequate level as identified by patient  08/24/2020 0612 by Kristine Linea, RN  Outcome: Progressing  08/24/2020 0604 by Kristine Linea, RN  Outcome: Progressing     Problem: Side Effects from Pain Analgesia  Goal: Patient will experience minimal side effects of analgesic therapy  08/24/2020 0612 by Kristine Linea, RN  Outcome: Progressing  08/24/2020 0604 by Kristine Linea, RN  Outcome: Progressing

## 2020-08-24 NOTE — Progress Notes (Signed)
Podiatric Surgery Daily Progress Note   Date/Time: 08/24/20 10:38 PM  Hospital Day: 4      ASSESSMENT & PLAN OF CARE:   Daniel Hebert is a 60 y.o. male     Osteomyelitis 2nd toe RT foot  Cellulitis RT foot  Charcot osteoarthropathy midfoot RT  Diabetes with neuropathy  - Patient has acute infection RT foot involving Osteomyelitis distal 2nd toe and cellulitis forefoot  - Xrays RT foot notes no soft tissue gas, but this soft tissue swelling 2nd toe and osteolytic changes to distal phalanx.  There is lateral subluxation of tarsometatarsal joints suggestive of Charcot osteoarthropathy  - MRI RT foot positive for Osteomyelitis distal and middle phalanx 2nd toe; no deep abscess forefoot   - I rediscussed pathology and treatment options with Patient.  Informed Patient that BEST Treatment option would be surgical cure of Osteomyelitis involving Partial 2nd toe Amputation.  Patient is still REFUSING surgical care RT foot.  Alternative option would be IV abx for 6-8 weeks, but it wouldn't eliminate infection to 2nd toe RT foot.  Also Patient may not be a suitable candidate for PICC line.    - Patient has improvement to Cellulitis RT foot, but he still believes that receiving past few days of IV abx and possibility of receiving Hyperbaric oxygen (HBO) and his concoction of herbs will cure infection RT foot.  I reiterated to Patient that he more than likely is not a candidate for HBO and having course of 6-8 weeks of IV abx isn't a guarantee to cure Osteomyelitis within 2nd toe.  Informed Patient that to do surgery now would involve Partial 2nd toe amputation RT foot.  If he delays surgery by few weeks or more, he may require a Total 2nd toe amputation.   Patient is willing to take his chances on nonsurgical care for RT foot.  - Due to Patient's mindset, Patient would do best with being d/ced with PO abx and he f/u with [facility] for HBO consultation.  Patient needs to explore his own path of care before realizing  that what current specialists are discussing with him is viable and recommended treatment options that is in line with standard of care.  - Patient to continue IV abx while inhouse  - Pain meds prn  - WBAT RT foot    SUBJECTIVE:   AVSS  NO SOB or Chest pain  No N/V/F/C  mild pain RT foot    OBJECTIVE:   Vital Signs   Current Vitals: Blood pressure (!) 152/91, pulse 95, temperature 98.1 F (36.7 C), temperature source Oral, resp. rate 18, height 1.905 m (6\' 3" ), weight 100.7 kg (222 lb), SpO2 97 %.  Prior 24 hours: Temp:  [97.5 F (36.4 C)-98.4 F (36.9 C)] 98.1 F (36.7 C)  Heart Rate:  [76-95] 95  Resp Rate:  [16-18] 18  BP: (128-192)/(82-107) 152/91    Physical Examination:  Vasc exam: palpable pedal pulses RT foot  Neuro exam: decreased protective threshold RT foot  Derm exam: decreased erythema forefoot RT; 0.5cm x 0.5cm x 0.3cm full thickness fibrotic ulcer distal tuft 2nd toe RT foot with probe deep to bone; no purulence ulcer 2nd toe RT foot; no malodor ulcer 2nd toe RT foot  Ortho exam: sausage digit swelling 2nd toe RT foot    LABORATORY RESULTS:   Hematology (most recent):  Lab Results   Component Value Date/Time    WBC 4.91 08/24/2020 06:35 AM    HCT 40.2 08/24/2020 06:35  AM    HGB 14.0 08/24/2020 06:35 AM   Chemistry (most recent):  Lab Results   Component Value Date/Time    NA 136 08/24/2020 06:35 AM    K 4.7 08/24/2020 06:35 AM    CL 104 08/24/2020 06:35 AM    CO2 25 08/24/2020 06:35 AM    BUN 12 08/24/2020 06:35 AM    CREAT 1.0 08/24/2020 06:35 AM    GLU 214 (H) 08/24/2020 06:35 AM    CA 9.3 08/24/2020 06:35 AM    MG 1.9 08/24/2020 06:35 AM   Coagulation Profile (most recent):  Lab Results   Component Value Date/Time    PT 15.6 (H) 08/22/2020 06:09 AM    PTT 27 08/22/2020 06:09 AM    INR 1.3 (H) 08/22/2020 06:09 AM   Hepatic Profile (most recent):  Lab Results   Component Value Date/Time    AST 46 (H) 08/24/2020 06:35 AM    ALT 53 08/24/2020 06:35 AM   Nutrition Profile (last two values):  Lab  Results   Component Value Date/Time    ALB 3.1 (L) 08/24/2020 06:35 AM    ALB 3.0 (L) 08/23/2020 06:18 AM       Pathology  Specimens (From admission, onward)      None            Microbiology  Wound Cx:   Blood Cx: NGTD    RADIOLOGY:   Relevant, recent radiological imaging results reviewed.    CHART DOCUMENTED PROBLEM LIST:     Patient Active Problem List   Diagnosis    Cellulitis of left lower extremity       Signed by:  Gilford Silvius, DPM, FACFAS  Podiatric Surgery   [facility]  Pager # [phone]
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
## 2020-08-24 NOTE — Progress Notes (Signed)
Patient alert and oriented X4. Refuses all meds except IV abx and amlodipine.

## 2020-08-25 LAB — BASIC METABOLIC PANEL
Anion Gap: 6 (ref 5.0–15.0)
BUN: 14 mg/dL (ref 9–28)
CO2: 25 mEq/L (ref 22–29)
Calcium: 9.9 mg/dL (ref 8.5–10.5)
Chloride: 104 mEq/L (ref 100–111)
Creatinine: 1.1 mg/dL (ref 0.7–1.3)
Glucose: 291 mg/dL — ABNORMAL HIGH (ref 70–100)
Potassium: 4.9 mEq/L (ref 3.5–5.1)
Sodium: 135 mEq/L — ABNORMAL LOW (ref 136–145)

## 2020-08-25 LAB — MAGNESIUM: Magnesium: 2 mg/dL (ref 1.6–2.6)

## 2020-08-25 LAB — GLUCOSE WHOLE BLOOD - POCT
Whole Blood Glucose POCT: 284 mg/dL — ABNORMAL HIGH (ref 70–100)
Whole Blood Glucose POCT: 307 mg/dL — ABNORMAL HIGH (ref 70–100)
Whole Blood Glucose POCT: 309 mg/dL — ABNORMAL HIGH (ref 70–100)
Whole Blood Glucose POCT: 330 mg/dL — ABNORMAL HIGH (ref 70–100)

## 2020-08-25 LAB — CBC
Absolute NRBC: 0 10*3/uL (ref 0.00–0.00)
Hematocrit: 45.1 % (ref 37.6–49.6)
Hgb: 14.9 g/dL (ref 12.5–17.1)
MCH: 28.7 pg (ref 25.1–33.5)
MCHC: 33 g/dL (ref 31.5–35.8)
MCV: 86.7 fL (ref 78.0–96.0)
MPV: 8.3 fL — ABNORMAL LOW (ref 8.9–12.5)
Nucleated RBC: 0 /100 WBC (ref 0.0–0.0)
Platelets: 304 10*3/uL (ref 142–346)
RBC: 5.2 10*6/uL (ref 4.20–5.90)
RDW: 12 % (ref 11–15)
WBC: 5.58 10*3/uL (ref 3.10–9.50)

## 2020-08-25 LAB — GFR: EGFR: >60

## 2020-08-25 MED ORDER — POLYETHYLENE GLYCOL 3350 17 G PO PACK
17.0000 g | PACK | Freq: Every day | ORAL | Status: DC
Start: 2020-08-25 — End: 2020-09-03
  Administered 2020-08-25 – 2020-09-03 (×9): 17 g via ORAL
  Filled 2020-08-25 (×9): qty 1

## 2020-08-25 MED ORDER — DOCUSATE SODIUM 100 MG PO CAPS
100.0000 mg | ORAL_CAPSULE | Freq: Every day | ORAL | Status: DC
Start: 2020-08-25 — End: 2020-09-03
  Administered 2020-08-25 – 2020-09-03 (×10): 100 mg via ORAL
  Filled 2020-08-25 (×10): qty 1

## 2020-08-25 NOTE — Plan of Care (Signed)
Problem: Safety  Goal: Patient will be free from injury during hospitalization  Outcome: Progressing  Goal: Patient will be free from infection during hospitalization  Outcome: Progressing     Problem: Pain  Goal: Pain at adequate level as identified by patient  Outcome: Progressing     Problem: Side Effects from Pain Analgesia  Goal: Patient will experience minimal side effects of analgesic therapy  Outcome: Progressing     Problem: Discharge Barriers  Goal: Patient will be discharged home or other facility with appropriate resources  Outcome: Progressing  Patient is alert and oriented X 4 and is independent in the room. Pt is non-compliant with blood sugar treatment stating he has stated multiple times that he will not take insulin as he believes it worsened the situation of his foot. Pt reports he takes cinnamon and some special herbs that he uses for blood glucose treatment and its in his car but no one would allow him to go get it. Education provided on the importance of receiving insulin treatment as his blood sugar at bedtime time was 330 but pt remains adamant. Pt also refuses care of his foot and prefers to have it open to air. Pt able to allow IV antibiotics treatment and medication is given as scheduled. Safety precautions maintained. Will continue to monitor.

## 2020-08-25 NOTE — Plan of Care (Signed)
No acute change this shift, no new complaints, no c/o pain, VSS, BGL elevated and refusing insulin, receiving IV abx but refused all other meds. Pt performing own self-advised wound care, had peeled pieces of wound off toe during previous shift and reports toe "is 100% better". Continues to use home ointment and leave open to air remaining barefoot. Declining other wound care or wound care education from staff. Able to sleep much of the shift, voiding spontaneously, ambulating safely. No safety concerns noted during this shift.       Problem: Discharge Barriers  Goal: Patient will be discharged home or other facility with appropriate resources  Outcome: Not Progressing     Problem: Psychosocial and Spiritual Needs  Goal: Demonstrates ability to cope with hospitalization/illness  Outcome: Not Progressing     Problem: Compromised Tissue integrity  Goal: Damaged tissue is healing and protected  Outcome: Not Progressing  Goal: Nutritional status is improving  Outcome: Not Progressing     Problem: Diabetes: Glucose Imbalance  Goal: Blood glucose stable at established goal  Outcome: Not Progressing     Problem: Safety  Goal: Patient will be free from injury during hospitalization  Outcome: Progressing  Goal: Patient will be free from infection during hospitalization  Outcome: Progressing     Problem: Pain  Goal: Pain at adequate level as identified by patient  Outcome: Progressing     Problem: Side Effects from Pain Analgesia  Goal: Patient will experience minimal side effects of analgesic therapy  Outcome: Progressing

## 2020-08-25 NOTE — Progress Notes (Signed)
INFECTIOUS DISEASES PROGRESS NOTE       Antibiotics:    Piptazo and vancomycin #4      Assessment:   Mr Cheese is a 60 yo man with history of brain tumor, MRSA infections, diabetes    Admitted 7/19 with R foot swelling, wound    Afebrile  No leukocytosis  R 2nd toe osteo  Culture with Enterococcus and Ecoli  Charcot foot and pathologic fracture of 2nd toe    Knives found in his room - removed by security  Seen by psychiatry  Refusing surgical management  Poor candidate for outpatient IV antibiotic    He feels he is improving. He is not interested in prolonged antibiotic course.    Plan:    Osteo, foot wound  Sensitivities requested  Continue piptazo and vancomycin in house  On discharge can change to doxycycline and Keflex but not expected to cure underlying osteo  Podiatry signed off - pt refusing intervention    Discussed with patient and Dr Lewis Moccasin  [facility]    [phone]  Pager: [phone]    Subjective:    Afebrile with max 98.1  Tolerating antibiotics without itching, rash, nausea, vomiting, or diarrhea  Better    Review of Systems:    No fevers, chills, headache, vision changes, nausea, vomiting, abdominal pain, diarrhea, chest pain, sob, arthralgias, myalgias, rashes, dysuria, weakness    Medications:     Current Facility-Administered Medications   Medication Dose Route Frequency    amLODIPine  10 mg Oral Daily    cadexomer iodine   Topical Daily    docusate sodium  100 mg Oral Daily    heparin (porcine)  5,000 Units Subcutaneous Q12H SCH    insulin glargine  20 Units Subcutaneous QHS    insulin lispro  1-3 Units Subcutaneous QHS    insulin lispro  1-5 Units Subcutaneous TID AC    insulin lispro  3 Units Subcutaneous Once    piperacillin-tazobactam  4.5 g Intravenous Q8H    polyethylene glycol  17 g Oral Daily    vancomycin  1,500 mg Intravenous Q12H    vancomycin   Intravenous See Admin Instructions       Physical Exam:      Vitals:    08/25/20 0740   BP: (!) 183/94    Pulse: 74   Resp: 17   Temp: 98.1 F (36.7 C)   SpO2: 97%     Vitals reviewed.     GEN: NAD. Appears comfortable in chair  HEENT: anicteric  CV: normal S1S2, RR, no MGR  PULM: CTAB, no wheezing  ABD: NABS, soft, nontender, nondistended  GU: no foley  EXT: L 2nd toe swelling, mild forefoot redness  DERM: No rashes     Lines:    Central Line: no  PIV without erythema or tenderness    Microbiology:    Woudn 7/21 Ecoli, Enterococcus, GNR  Blood cx 7/19 NGTD    All microbiology results reviewed.     Labs:      Recent Labs     08/25/20  0800   WBC 5.58   Hgb 14.9   Hematocrit 45.1   Platelets 304         Recent Labs     08/25/20  0800   Glucose 291*   BUN 14   Creatinine 1.1   Sodium 135*   Potassium 4.9   Chloride 104   CO2 25  Lab results reviewed.    Radiology:      Radiology Results (24 Hour)       Procedure Component Value Units Date/Time    [place] Noninvas Low Extrem Art Dopp/Press/Wavefrms (PVR) Comp 3-4 Levels [[id number]] Collected: 08/24/20 1002    Order Status: Completed Updated: 08/24/20 1007    Narrative:      EXAM: Bilateral lower extremity arterial non-invasive studies including  pulse volume recordings, Doppler sonogram and Duplex evaluation.     CLINICAL HISTORY:  Patient with diminished pulses and right second toe  osteomyelitis.    TECHNIQUE: Noninvasive arterial studies of the lower extremities were  performed using   segmental limb pressures, volume plethysmography, and  continuous wave Doppler analysis.   Doppler segmental pressures were  obtained from the proximal thighs to the ankle level, pulse volume  recordings from the proximal thighs to the metatarsal level, and Doppler  waveforms at both common femorals, popliteals, posterior tibials, and  dorsalis pedis arteries.      High resolution gray scale imaging, color Doppler and spectral waveform  analysis were used to evaluate right lower extremity arteries.    No old studies are available for comparison.    FINDINGS:   The resting  ankle-brachial indices are 1.27 right and 1.2 left. The toe  brachial index on the right side is 0.95 and on the left side its 0.74.    Right Lower Extremity:       Segmental pressures demonstrate no asymmetry or pressure gradient       Pulse volume recordings are normal       Doppler waveforms are satisfactory at all levels.       Duplex imaging no focal elevated velocity seen in the right lower  extremity to suggest a significant stenosis. The femoropopliteal segment  is patent. Minimal plaque is identified. Biphasic and triphasic  waveforms are seen to the distal tibial level.    The toe PPG's are unremarkable and symmetric including the right second  digit.    Left Lower Extremity:       Segmental pressures demonstrate no asymmetry or pressure gradient       Pulse volume recordings are normal       Doppler waveforms are satisfactory at all levels.      Impression:         1. Right lower extremity: No arterial insufficiency of the right lower  extremity at rest with triphasic distal tibial waveforms and normal  waveforms of the right second toe.  2. Left lower extremity: No arterial insufficiency of the left lower  extremity at rest.    Adelina Mings, MD   08/24/2020 10:05 AM    US Arterial / Graft Duplex Doppler Lower Extremity Right [[id number]] Collected: 08/24/20 1002    Order Status: Completed Updated: 08/24/20 1007    Narrative:      EXAM: Bilateral lower extremity arterial non-invasive studies including  pulse volume recordings, Doppler sonogram and Duplex evaluation.     CLINICAL HISTORY:  Patient with diminished pulses and right second toe  osteomyelitis.    TECHNIQUE: Noninvasive arterial studies of the lower extremities were  performed using   segmental limb pressures, volume plethysmography, and  continuous wave Doppler analysis.   Doppler segmental pressures were  obtained from the proximal thighs to the ankle level, pulse volume  recordings from the proximal thighs to the metatarsal level, and  Doppler  waveforms at both common femorals, popliteals, posterior tibials, and  dorsalis pedis arteries.  High resolution gray scale imaging, color Doppler and spectral waveform  analysis were used to evaluate right lower extremity arteries.    No old studies are available for comparison.    FINDINGS:   The resting ankle-brachial indices are 1.27 right and 1.2 left. The toe  brachial index on the right side is 0.95 and on the left side its 0.74.    Right Lower Extremity:       Segmental pressures demonstrate no asymmetry or pressure gradient       Pulse volume recordings are normal       Doppler waveforms are satisfactory at all levels.       Duplex imaging no focal elevated velocity seen in the right lower  extremity to suggest a significant stenosis. The femoropopliteal segment  is patent. Minimal plaque is identified. Biphasic and triphasic  waveforms are seen to the distal tibial level.    The toe PPG's are unremarkable and symmetric including the right second  digit.    Left Lower Extremity:       Segmental pressures demonstrate no asymmetry or pressure gradient       Pulse volume recordings are normal       Doppler waveforms are satisfactory at all levels.      Impression:         1. Right lower extremity: No arterial insufficiency of the right lower  extremity at rest with triphasic distal tibial waveforms and normal  waveforms of the right second toe.  2. Left lower extremity: No arterial insufficiency of the left lower  extremity at rest.    Adelina Mings, MD   08/24/2020 10:05 AM            All radiological procedures reviewed.

## 2020-08-25 NOTE — Progress Notes (Signed)
[facility]  HOSPITALIST  PROGRESS NOTE      Patient: Daniel Hebert  Date: 08/25/2020   LOS: 4 Days  Admission Date: 08/21/2020   MRN: [id number]  Attending: Drue Dun, MD       ASSESSMENT/PLAN     Daniel Hebert is a 59 y.o. male admitted with below       Osteomyelitis and Diabetic ulcer right second toe, cellulitis right foot, Charcot osteoarthropathy midfoot RT:  - Erythema and cellulitis is improving  - Refusing amputation  - Refusing long-term IV antibiotics  - He feels that herbal supplements will heal his foot  - Continue with IV vancomycin and Zosyn per ID recs.  Wound culture positive for Enterococcus faecalis  - ID recs to eventually [place] home on doxycycline and Keflex.  - Arterial duplex negative  Uncontrolled DM: Refuses to take medications including oral or insulin.. Reports he takes herbs. A1C 8.7  Uncontrolled HTN: initially was refusing amlodipine, now agreeable  Concern for psychiatric disorder: On my evaluation, although patient seems to have some fixed believes but he seems to understand his disease process and he seems to understand different options that he has and he seems to understand possible consequences.  I believe patient has capacity to make his own decision although he is making decision that could not be his best interest.  He is agreeing to using the IV antibiotics for now and he is willing to think about the amputation.  Psych eval followed patient is competent to make his own decisions.    History of meningioma: Outpatient follow-up    Dispo: Home in 1 to 2 days on p.o. antibiotics which will likely not heal but patient is adamant about leaving soon and not willing to take long-term IV antibiotics or do the amputation.      Patient Lines/Drains/Airways Status       Active PICC Line / CVC Line / PIV Line / Drain / Airway / Intraosseous Line / Epidural Line / ART Line / Line / Wound / Pressure Ulcer / NG/OG Tube       Name Placement date Placement time Site Days    Peripheral IV  08/21/20 20 G Standard Left Antecubital 08/21/20  1730  Antecubital  1                             Care Plan discussed with nursing, and case manager       Doing okay  Subjective: Denies chest pain or shortness of breath, had slight headache which is gone now which she attributes to Percocet.  His blood pressure was also elevated which could be contributing.  Agreeable to taking BP meds but does not want to take anything for diabetes.  MEDICATIONS     Current Facility-Administered Medications   Medication Dose Route Frequency    amLODIPine  10 mg Oral Daily    cadexomer iodine   Topical Daily    docusate sodium  100 mg Oral Daily    heparin (porcine)  5,000 Units Subcutaneous Q12H SCH    insulin glargine  20 Units Subcutaneous QHS    insulin lispro  1-3 Units Subcutaneous QHS    insulin lispro  1-5 Units Subcutaneous TID AC    insulin lispro  3 Units Subcutaneous Once    piperacillin-tazobactam  4.5 g Intravenous Q8H    polyethylene glycol  17 g Oral Daily    vancomycin  1,500 mg Intravenous  Q12H    vancomycin   Intravenous See Admin Instructions       ROS       Neurological: No headache or dizziness  Respiratory: No cough or shortness of breath  Cardiovascular: No chest pain      PHYSICAL EXAM     Vitals:    08/25/20 1535   BP: 144/73   Pulse: 84   Resp: 17   Temp: 97.7 F (36.5 C)   SpO2: 98%       Temperature: Temp  Min: 97.5 F (36.4 C)  Max: 98.4 F (36.9 C)  Pulse: Pulse  Min: 70  Max: 95  Respiratory: Resp  Min: 16  Max: 18  Non-Invasive BP: BP  Min: 144/73  Max: 183/94  Pulse Oximetry SpO2  Min: 97 %  Max: 98 %    Intake and Output Summary (Last 24 hours) at Date Time  No intake or output data in the 24 hours ending 08/25/20 1622      GEN APPEARANCE: Alert and oriented   HEENT: PERLA; EOMI; Conjunctiva Clear  NECK: Supple; No bruits  CVS: RRR, S1, S2; No M/G/R  LUNGS: CTAB; No Wheezes; No Rhonchi: No rales  Vascular: Dorsalis pedis pulses are palpable bilaterally though faint  ABD: Soft; No TTP; +  Normoactive BS  EXT: Right foot erythema, swelling, ulcer right 2nd toe, no odors, no discharge  SKIN: No rash or Lesions  NEURO: CN 2-12 intact; No Focal neurological deficits.  Gait was also assessed and it is intact.  Psych: Patient is cooperative, I believe he has some paranoid believes.  Always looking around him and watching my eyes were I am looking.  He prefers doors closed as he is worried that people will be listening to him    LABS     Recent Labs   Lab 08/25/20  0800 08/24/20  0635 08/23/20  0618   WBC 5.58 4.91 4.15   RBC 5.20 4.63 4.63   Hgb 14.9 14.0 13.3   Hematocrit 45.1 40.2 40.0   MCV 86.7 86.8 86.4   Platelets 304 267 231         Recent Labs   Lab 08/25/20  0800 08/24/20  0635 08/23/20  0618 08/22/20  0609 08/21/20  1736   Sodium 135* 136 135* 136 136   Potassium 4.9 4.7 4.3 3.8 4.0   Chloride 104 104 105 104 100   CO2 25 25 22 22 25    BUN 14 12 11 13 21    Creatinine 1.1 1.0 0.9 0.8 1.1   Glucose 291* 214* 274* 217* 301*   Calcium 9.9 9.3 9.0 9.0 9.9   Magnesium 2.0 1.9 1.9 1.9  --          Recent Labs   Lab 08/24/20  0635 08/23/20  0618 08/21/20  1736   ALT 53 30 40   AST (SGOT) 46* 18 25   Bilirubin, Total 0.8 0.7 0.4   Bilirubin Direct 0.2  --   --    Albumin 3.1* 3.0* 3.5   Alkaline Phosphatase 82 81 99               Recent Labs   Lab 08/22/20  0609   PT INR 1.3*   PT 15.6*   PTT 27         Microbiology Results (last 15 days)       Procedure Component Value Units Date/Time    Microbiology additional request [[id number]] Collected: 08/25/20 [phone]  Order Status: Completed Specimen: Blood from Wound Updated: 08/25/20 0957    Narrative:      [id number] - Ecoli sensitivities, thanks.  ORDER#: [id number]                                    ORDERED BY: Rayetta Humphrey, TREVOR  SOURCE: Wound wound                                  COLLECTED:  08/25/20 09:52  ANTIBIOTICS AT COLL.:                                RECEIVED :  08/25/20 09:52  Microbiology Add On Request                FINAL       08/25/20  09:57  08/25/20   For results, see #[id number]      Culture + Gram Azzie Glatter Wound [[id number]] Collected: 08/23/20 1724    Order Status: Completed Specimen: Wound from Abrasion Updated: 08/24/20 2210    Narrative:      ORDER#: [id number]                                    ORDERED BY: Elise Benne  SOURCE: Abrasion R 2nd toe                           COLLECTED:  08/23/20 17:24  ANTIBIOTICS AT COLL.:                                RECEIVED :  08/23/20 22:27  Stain, Gram                                FINAL       08/24/20 00:32   +  08/24/20   Few WBCs             No Squamous epithelial cells seen             Many Gram positive cocci  Culture and Gram Stain, Aerobic, Wound     PRELIM      08/24/20 22:10   +  08/24/20   Moderate growth of mixed cutaneous flora  08/24/20   Light growth of Escherichia coli               No further work,             Questionable significance due to low quantity    08/24/20   Moderate growth of Enterococcus faecalis               Further workup to follow including susceptibility testing    08/24/20   Very light growth of Gram negative rod               Identification to follow        Additional Culture Request [[id number]] Collected: 08/23/20 1724    Order Status: Completed Specimen: Abrasion Updated: 08/25/20 0956    Narrative:      ORDER#: [id number]  ORDERED BY: GOWDA, MURLIYA  SOURCE: Abrasion R 2nd toe                           COLLECTED:  08/23/20 17:24  ANTIBIOTICS AT COLL.:                                RECEIVED :  08/23/20 22:27  Additional Culture Request                 FINAL       08/25/20 09:56  08/25/20   E. coli susceptibility requested by physician:             Dr. Fredia Beets on Order [phone]      MRSA culture [[id number]] Collected: 08/22/20 2133    Order Status: Completed Specimen: Culturette from Nasal Swab Updated: 08/23/20 2038     Culture MRSA Surveillance Negative for Methicillin Resistant Staph aureus    MRSA culture  [[id number]] Collected: 08/22/20 2133    Order Status: Completed Specimen: Culturette from Throat Updated: 08/23/20 2038     Culture MRSA Surveillance Negative for Methicillin Resistant Staph aureus    Culture Blood Aerobic and Anaerobic [[id number]] Collected: 08/21/20 1809    Order Status: Completed Specimen: Arm from Blood, Venipuncture Updated: 08/25/20 0121    Narrative:      ORDER#: [id number]                                    ORDERED BY: Kelby Aline  SOURCE: Blood, Venipuncture Arm                      COLLECTED:  08/21/20 18:09  ANTIBIOTICS AT COLL.:                                RECEIVED :  08/22/20 00:57  Culture Blood Aerobic and Anaerobic        PRELIM      08/25/20 01:21  08/23/20   No Growth after 1 day/s of incubation.  08/24/20   No Growth after 2 day/s of incubation.  08/25/20   No Growth after 3 day/s of incubation.      Culture Blood Aerobic and Anaerobic [[id number]] Collected: 08/21/20 1736    Order Status: Completed Specimen: Arm from Blood, Venipuncture Updated: 08/25/20 0121    Narrative:      ORDER#: [id number]                                    ORDERED BY: Kelby Aline  SOURCE: Blood, Venipuncture Arm                      COLLECTED:  08/21/20 17:36  ANTIBIOTICS AT COLL.:                                RECEIVED :  08/22/20 00:57  Culture Blood Aerobic and Anaerobic        PRELIM      08/25/20 01:21  08/23/20   No Growth after 1 day/s of incubation.  08/24/20   No Growth after 2 day/s  of incubation.  08/25/20   No Growth after 3 day/s of incubation.               RADIOLOGY     Radiological Procedure personally reviewed and concur with radiologist reports unless stated otherwise.    US Arterial / Graft Duplex Doppler Lower Extremity Right   Final Result       1. Right lower extremity: No arterial insufficiency of the right lower   extremity at rest with triphasic distal tibial waveforms and normal   waveforms of the right second toe.   2. Left lower extremity: No arterial insufficiency of the  left lower   extremity at rest.      Adelina Mings, MD    08/24/2020 10:05 AM      [place] Noninvas Low Extrem Art Dopp/Press/Wavefrms (PVR) Comp 3-4 Levels   Final Result       1. Right lower extremity: No arterial insufficiency of the right lower   extremity at rest with triphasic distal tibial waveforms and normal   waveforms of the right second toe.   2. Left lower extremity: No arterial insufficiency of the left lower   extremity at rest.      Adelina Mings, MD    08/24/2020 10:05 AM      MRI Foot Right W WO Contrast   Final Result      1.  Osseous erosion and fragmentation of the second toe distal phalanx   with abnormal marrow edema in the second toe middle and distal   phalanges, compatible with osteomyelitis.      2.  Degenerative changes of the midfoot with adjacent osseous erosions   and extensive marrow edema/enhancement. These findings are likely due to   Charcot arthropathy, although superimposed infection is in region is   difficult to exclude.      3.  Diffuse subcutaneous edema and mild enhancement throughout the foot,   especially in the second toe, compatible with cellulitis. No discrete   abscess or drainable fluid collection is identified.      These urgent results were discussed with and acknowledged by Burnett Kanaris, DO, on 08/22/2020 at 2:50 PM.      Carla Drape, MD    08/22/2020 2:50 PM      Foot Right AP Lateral And Oblique   Final Result         1.  Abnormal lucency, erosion, and fragmentation of the second toe   distal phalanx, most likely representing osteomyelitis with superimposed   pathologic fracture. There is pronounced soft tissue swelling of the   second toe and dorsal foot.      2.  Abnormal lateral subluxation of the second TMT joint and, to a   slightly lesser extent, third and fourth TMT joints, concerning for   Lisfranc ligamentous injury. There are associated degenerative changes   and ossification along the dorsal midfoot, which may be reflective of   developing Charcot  arthropathy.      3.  NOTIFICATION: The critical findings of this study were discussed   with, and acknowledged by, Dr. Kelby Aline via telephone at 6:53 PM on   08/21/2020.      Vassie Moment, MD    08/21/2020 6:54 PM          Signed,  Drue Dun, MD  4:22 PM 08/25/2020

## 2020-08-25 NOTE — Plan of Care (Signed)
Shift Note:      Orientation:A/O x 4  Rhythm on tele: NT  Oxygen: RA  Ambulation: Independent  Pain: Tylenol  Lines/Drips: IV abx  GI/GU: c x 2  Fall Score: Low    Critical Labs/Imaging/Procedures:     Comments:    - see doc flow for complete assessment and vitals.     Significant Shift Events and Questions for Attending:    Plan:   - Vitals Q4H   - Wound care  - Pain control  - IV abx      (Braden Score: ?18 include skin assessment template)  Skin Assessment: Cosign Note:    Skin WNL EXCEPT for:     HEAD:   LUE:   RUE:   TORSO:   BACK:  SACRUM:  BUTTOCKS/INGUINAL/PERINEAL:  LLE:  RLE:  OTHER:

## 2020-08-26 LAB — GLUCOSE WHOLE BLOOD - POCT
Whole Blood Glucose POCT: 279 mg/dL — ABNORMAL HIGH (ref 70–100)
Whole Blood Glucose POCT: 299 mg/dL — ABNORMAL HIGH (ref 70–100)
Whole Blood Glucose POCT: 355 mg/dL — ABNORMAL HIGH (ref 70–100)
Whole Blood Glucose POCT: 389 mg/dL — ABNORMAL HIGH (ref 70–100)

## 2020-08-26 LAB — CBC
Absolute NRBC: 0 10*3/uL (ref 0.00–0.00)
Hematocrit: 44 % (ref 37.6–49.6)
Hgb: 15.5 g/dL (ref 12.5–17.1)
MCH: 30.2 pg (ref 25.1–33.5)
MCHC: 35.2 g/dL (ref 31.5–35.8)
MCV: 85.6 fL (ref 78.0–96.0)
MPV: 8.3 fL — ABNORMAL LOW (ref 8.9–12.5)
Nucleated RBC: 0 /100 WBC (ref 0.0–0.0)
Platelets: 312 10*3/uL (ref 142–346)
RBC: 5.14 10*6/uL (ref 4.20–5.90)
RDW: 12 % (ref 11–15)
WBC: 6.22 10*3/uL (ref 3.10–9.50)

## 2020-08-26 LAB — BASIC METABOLIC PANEL
Anion Gap: 9 (ref 5.0–15.0)
BUN: 12 mg/dL (ref 9–28)
CO2: 20 mEq/L — ABNORMAL LOW (ref 22–29)
Calcium: 9.8 mg/dL (ref 8.5–10.5)
Chloride: 104 mEq/L (ref 100–111)
Creatinine: 0.9 mg/dL (ref 0.7–1.3)
Glucose: 270 mg/dL — ABNORMAL HIGH (ref 70–100)
Potassium: 4.9 mEq/L (ref 3.5–5.1)
Sodium: 133 mEq/L — ABNORMAL LOW (ref 136–145)

## 2020-08-26 LAB — VANCOMYCIN, TROUGH
Vancomycin Time of Last Dose: 5
Vancomycin Trough: 12.2 ug/mL (ref 10.0–20.0)

## 2020-08-26 LAB — GFR: EGFR: >60

## 2020-08-26 LAB — MAGNESIUM: Magnesium: 1.9 mg/dL (ref 1.6–2.6)

## 2020-08-26 NOTE — Progress Notes (Signed)
[facility]  HOSPITALIST  PROGRESS NOTE      Patient: Daniel Hebert  Date: 08/26/2020   LOS: 5 Days  Admission Date: 08/21/2020   MRN: [id number]  Attending: Drue Dun, MD       ASSESSMENT/PLAN     Tra Wilemon is a 60 y.o. male admitted with below       Osteomyelitis and Diabetic ulcer right second toe, cellulitis right foot, Charcot osteoarthropathy midfoot RT:  - Erythema and cellulitis improved  - not willing to take long-term IV antibiotics or do the amputation but he is willing to consider the amputation after trial of antibiotics  - He feels that herbal supplements will heal his foot  - Continue with IV vancomycin and Zosyn per ID recs.  Wound culture positive for Enterococcus faecalis  - ID recs to eventually [place] home on doxycycline and Keflex.  - Arterial duplex negative    Uncontrolled DM: Refuses to take medications including oral or insulin.. Reports he takes herbs. A1C 8.7    Uncontrolled HTN: initially was refusing amlodipine, now agreeable  Concern for psychiatric disorder: He seems to understand his disease process and he seems to understand different options that he has and he seems to understand possible consequences.  I believe patient has capacity to make his own decision although he is making decision that could not be his best interest.  He is agreeing to using the IV antibiotics for now and he is willing to think about the amputation but would like to give antibiotics a chance, fixated about hyperbaric chamber.  Psych evaluated patient and agreed he is competent to make his own decisions.    History of meningioma: Outpatient follow-up  Stable hyponatremia    Dispo: Home in 1 to 2 days on p.o. antibiotics which will likely not heal but patient is adamant about leaving soon and not willing to take long-term IV antibiotics or do the amputation but he is willing to consider the amputation after trial of antibiotics      Patient Lines/Drains/Airways Status       Active PICC Line / CVC Line  / PIV Line / Drain / Airway / Intraosseous Line / Epidural Line / ART Line / Line / Wound / Pressure Ulcer / NG/OG Tube       Name Placement date Placement time Site Days    Peripheral IV 08/21/20 20 G Standard Left Antecubital 08/21/20  1730  Antecubital  1                             Care Plan discussed with nursing, and case manager       Doing okay  Subjective: No pain  Feels ok  MEDICATIONS     Current Facility-Administered Medications   Medication Dose Route Frequency    amLODIPine  10 mg Oral Daily    cadexomer iodine   Topical Daily    docusate sodium  100 mg Oral Daily    heparin (porcine)  5,000 Units Subcutaneous Q12H SCH    insulin glargine  20 Units Subcutaneous QHS    insulin lispro  1-3 Units Subcutaneous QHS    insulin lispro  1-5 Units Subcutaneous TID AC    insulin lispro  3 Units Subcutaneous Once    piperacillin-tazobactam  4.5 g Intravenous Q8H    polyethylene glycol  17 g Oral Daily    vancomycin  1,500 mg Intravenous Q12H    vancomycin  Intravenous See Admin Instructions       ROS       Neurological: No headache or dizziness  Respiratory: No cough or shortness of breath  Cardiovascular: No chest pain      PHYSICAL EXAM     Vitals:    08/26/20 0929   BP: 146/89   Pulse: 80   Resp: 16   Temp: 97.9 F (36.6 C)   SpO2: 99%       Temperature: Temp  Min: 97.6 F (36.4 C)  Max: 97.9 F (36.6 C)  Pulse: Pulse  Min: 71  Max: 84  Respiratory: Resp  Min: 16  Max: 17  Non-Invasive BP: BP  Min: 144/73  Max: 159/93  Pulse Oximetry SpO2  Min: 94 %  Max: 99 %    Intake and Output Summary (Last 24 hours) at Date Time  No intake or output data in the 24 hours ending 08/26/20 1448      GEN APPEARANCE: Alert and oriented   CVS: RRR, no gallup  LUNGS: CTAB; No Wheezes; No Rhonchi: No rales  Vascular: Dorsalis pedis pulses are palpable  ABD: Soft; No TTP  EXT: Right foot erythema improved, 2nd toe with ulcer  NEURO: CN 2-12 intact; No Focal neurological deficits.  Gait was also assessed and it is  intact.  Psych: Patient is cooperative, I believe he has some paranoid believes.  Always looking around him and watching my eyes were I am looking.  He prefers doors closed as he is worried that people will be listening to him. Also shows some grandiose thought, but no hallucinations or delutions    LABS     Recent Labs   Lab 08/26/20  0709 08/25/20  0800 08/24/20  0635   WBC 6.22 5.58 4.91   RBC 5.14 5.20 4.63   Hgb 15.5 14.9 14.0   Hematocrit 44.0 45.1 40.2   MCV 85.6 86.7 86.8   Platelets 312 304 267         Recent Labs   Lab 08/26/20  0709 08/25/20  0800 08/24/20  0635 08/23/20  0618 08/22/20  0609   Sodium 133* 135* 136 135* 136   Potassium 4.9 4.9 4.7 4.3 3.8   Chloride 104 104 104 105 104   CO2 20* 25 25 22 22    BUN 12 14 12 11 13    Creatinine 0.9 1.1 1.0 0.9 0.8   Glucose 270* 291* 214* 274* 217*   Calcium 9.8 9.9 9.3 9.0 9.0   Magnesium 1.9 2.0 1.9 1.9 1.9         Recent Labs   Lab 08/24/20  0635 08/23/20  0618 08/21/20  1736   ALT 53 30 40   AST (SGOT) 46* 18 25   Bilirubin, Total 0.8 0.7 0.4   Bilirubin Direct 0.2  --   --    Albumin 3.1* 3.0* 3.5   Alkaline Phosphatase 82 81 99               Recent Labs   Lab 08/22/20  0609   PT INR 1.3*   PT 15.6*   PTT 27         Microbiology Results (last 15 days)       Procedure Component Value Units Date/Time    Microbiology additional request [[id number]] Collected: 08/25/20 0952    Order Status: Completed Specimen: Blood from Wound Updated: 08/25/20 0957    Narrative:      [id number] - Ecoli sensitivities, thanks.  ORDER#:  [id number]                                    ORDERED BY: UYEMURA, TREVOR  SOURCE: Wound wound                                  COLLECTED:  08/25/20 09:52  ANTIBIOTICS AT COLL.:                                RECEIVED :  08/25/20 09:52  Microbiology Add On Request                FINAL       08/25/20 09:57  08/25/20   For results, see #[id number]      Culture + Gram Azzie Glatter Wound [[id number]] Collected: 08/23/20 1724    Order Status: Completed  Specimen: Wound from Abrasion Updated: 08/25/20 2112    Narrative:      ORDER#: [id number]                                    ORDERED BY: Elise Benne  SOURCE: Abrasion R 2nd toe                           COLLECTED:  08/23/20 17:24  ANTIBIOTICS AT COLL.:                                RECEIVED :  08/23/20 22:27  Stain, Gram                                FINAL       08/24/20 00:32   +  08/24/20   Few WBCs             No Squamous epithelial cells seen             Many Gram positive cocci  Culture and Gram Stain, Aerobic, Wound     PRELIM      08/25/20 21:12   +  08/24/20   Moderate growth of mixed cutaneous flora  08/25/20   Light growth of Escherichia coli               Further workup to follow including susceptibility testing    08/25/20   Moderate growth of Enterococcus faecalis               Further workup to follow including susceptibility testing    08/25/20   Very light growth of Klebsiella pneumoniae               Further workup to follow including susceptibility testing        Additional Culture Request [[id number]] Collected: 08/23/20 1724    Order Status: Completed Specimen: Abrasion Updated: 08/25/20 0956    Narrative:      ORDER#: [id number]                                    ORDERED BY: Elise Benne  SOURCE: Abrasion R 2nd toe                           COLLECTED:  08/23/20 17:24  ANTIBIOTICS AT COLL.:                                RECEIVED :  08/23/20 22:27  Additional Culture Request                 FINAL       08/25/20 09:56  08/25/20   E. coli susceptibility requested by physician:             Dr. Fredia Beets on Order [phone]      MRSA culture [[id number]] Collected: 08/22/20 2133    Order Status: Completed Specimen: Culturette from Nasal Swab Updated: 08/23/20 2038     Culture MRSA Surveillance Negative for Methicillin Resistant Staph aureus    MRSA culture [[id number]] Collected: 08/22/20 2133    Order Status: Completed Specimen: Culturette from Throat Updated: 08/23/20 2038     Culture MRSA  Surveillance Negative for Methicillin Resistant Staph aureus    Culture Blood Aerobic and Anaerobic [[id number]] Collected: 08/21/20 1809    Order Status: Completed Specimen: Arm from Blood, Venipuncture Updated: 08/26/20 0121    Narrative:      ORDER#: [id number]                                    ORDERED BY: Kelby Aline  SOURCE: Blood, Venipuncture Arm                      COLLECTED:  08/21/20 18:09  ANTIBIOTICS AT COLL.:                                RECEIVED :  08/22/20 00:57  Culture Blood Aerobic and Anaerobic        PRELIM      08/26/20 01:21  08/23/20   No Growth after 1 day/s of incubation.  08/24/20   No Growth after 2 day/s of incubation.  08/25/20   No Growth after 3 day/s of incubation.  08/26/20   No Growth after 4 day/s of incubation.      Culture Blood Aerobic and Anaerobic [[id number]] Collected: 08/21/20 1736    Order Status: Completed Specimen: Arm from Blood, Venipuncture Updated: 08/26/20 0121    Narrative:      ORDER#: [id number]                                    ORDERED BY: Kelby Aline  SOURCE: Blood, Venipuncture Arm                      COLLECTED:  08/21/20 17:36  ANTIBIOTICS AT COLL.:                                RECEIVED :  08/22/20 00:57  Culture Blood Aerobic and Anaerobic        PRELIM      08/26/20 01:21  08/23/20   No Growth after 1 day/s of incubation.  08/24/20  No Growth after 2 day/s of incubation.  08/25/20   No Growth after 3 day/s of incubation.  08/26/20   No Growth after 4 day/s of incubation.               RADIOLOGY     Radiological Procedure personally reviewed and concur with radiologist reports unless stated otherwise.    US Arterial / Graft Duplex Doppler Lower Extremity Right   Final Result       1. Right lower extremity: No arterial insufficiency of the right lower   extremity at rest with triphasic distal tibial waveforms and normal   waveforms of the right second toe.   2. Left lower extremity: No arterial insufficiency of the left lower   extremity at rest.       Adelina Mings, MD    08/24/2020 10:05 AM      [place] Noninvas Low Extrem Art Dopp/Press/Wavefrms (PVR) Comp 3-4 Levels   Final Result       1. Right lower extremity: No arterial insufficiency of the right lower   extremity at rest with triphasic distal tibial waveforms and normal   waveforms of the right second toe.   2. Left lower extremity: No arterial insufficiency of the left lower   extremity at rest.      Adelina Mings, MD    08/24/2020 10:05 AM      MRI Foot Right W WO Contrast   Final Result      1.  Osseous erosion and fragmentation of the second toe distal phalanx   with abnormal marrow edema in the second toe middle and distal   phalanges, compatible with osteomyelitis.      2.  Degenerative changes of the midfoot with adjacent osseous erosions   and extensive marrow edema/enhancement. These findings are likely due to   Charcot arthropathy, although superimposed infection is in region is   difficult to exclude.      3.  Diffuse subcutaneous edema and mild enhancement throughout the foot,   especially in the second toe, compatible with cellulitis. No discrete   abscess or drainable fluid collection is identified.      These urgent results were discussed with and acknowledged by Burnett Kanaris, DO, on 08/22/2020 at 2:50 PM.      Carla Drape, MD    08/22/2020 2:50 PM      Foot Right AP Lateral And Oblique   Final Result         1.  Abnormal lucency, erosion, and fragmentation of the second toe   distal phalanx, most likely representing osteomyelitis with superimposed   pathologic fracture. There is pronounced soft tissue swelling of the   second toe and dorsal foot.      2.  Abnormal lateral subluxation of the second TMT joint and, to a   slightly lesser extent, third and fourth TMT joints, concerning for   Lisfranc ligamentous injury. There are associated degenerative changes   and ossification along the dorsal midfoot, which may be reflective of   developing Charcot arthropathy.      3.  NOTIFICATION: The  critical findings of this study were discussed   with, and acknowledged by, Dr. Kelby Aline via telephone at 6:53 PM on   08/21/2020.      Vassie Moment, MD    08/21/2020 6:54 PM          Signed,  Drue Dun, MD  2:48 PM 08/26/2020

## 2020-08-26 NOTE — Plan of Care (Signed)
Problem: Safety  Goal: Patient will be free from injury during hospitalization  Flowsheets (Taken 08/26/2020 0907)  Patient will be free from injury during hospitalization: Provide and maintain safe environment  Note: Pt knows to call for assistance. Bed is in lowest position. Bed alarm is on. Call bell is in reach.       Problem: Pain  Goal: Pain at adequate level as identified by patient  Flowsheets (Taken 08/26/2020 0907)  Pain at adequate level as identified by patient: Identify patient comfort function goal     Problem: Compromised Tissue integrity  Goal: Damaged tissue is healing and protected  Flowsheets (Taken 08/26/2020 0907)  Damaged tissue is healing and protected:   Reposition patient every 2 hours and as needed unless able to reposition self   Relieve pressure to bony prominences for patients at moderate and high risk

## 2020-08-26 NOTE — Progress Notes (Signed)
Pt 's Blood Sugar is 389. He is refusing all blood sugar treatments. He states insulin  does more harm to his blood sugar than good. MD and charge notified.

## 2020-08-27 LAB — CBC
Absolute NRBC: 0 10*3/uL (ref 0.00–0.00)
Hematocrit: 43.1 % (ref 37.6–49.6)
Hgb: 14.8 g/dL (ref 12.5–17.1)
MCH: 29.5 pg (ref 25.1–33.5)
MCHC: 34.3 g/dL (ref 31.5–35.8)
MCV: 86 fL (ref 78.0–96.0)
MPV: 8.3 fL — ABNORMAL LOW (ref 8.9–12.5)
Nucleated RBC: 0 /100 WBC (ref 0.0–0.0)
Platelets: 300 10*3/uL (ref 142–346)
RBC: 5.01 10*6/uL (ref 4.20–5.90)
RDW: 12 % (ref 11–15)
WBC: 6.28 10*3/uL (ref 3.10–9.50)

## 2020-08-27 LAB — BASIC METABOLIC PANEL
Anion Gap: 9 (ref 5.0–15.0)
BUN: 15 mg/dL (ref 9–28)
CO2: 23 mEq/L (ref 22–29)
Calcium: 9.4 mg/dL (ref 8.5–10.5)
Chloride: 102 mEq/L (ref 100–111)
Creatinine: 1.1 mg/dL (ref 0.7–1.3)
Glucose: 282 mg/dL — ABNORMAL HIGH (ref 70–100)
Potassium: 5 mEq/L (ref 3.5–5.1)
Sodium: 134 mEq/L — ABNORMAL LOW (ref 136–145)

## 2020-08-27 LAB — GLUCOSE WHOLE BLOOD - POCT
Whole Blood Glucose POCT: 361 mg/dL — ABNORMAL HIGH (ref 70–100)
Whole Blood Glucose POCT: 283 mg/dL — ABNORMAL HIGH (ref 70–100)
Whole Blood Glucose POCT: 351 mg/dL — ABNORMAL HIGH (ref 70–100)
Whole Blood Glucose POCT: 342 mg/dL — ABNORMAL HIGH (ref 70–100)

## 2020-08-27 LAB — MAGNESIUM: Magnesium: 1.8 mg/dL (ref 1.6–2.6)

## 2020-08-27 LAB — GFR: EGFR: >60

## 2020-08-27 MED ORDER — OXYCODONE-ACETAMINOPHEN 5-325 MG PO TABS
1.0000 | ORAL_TABLET | Freq: Once | ORAL | Status: AC
Start: 2020-08-27 — End: 2020-08-27
  Administered 2020-08-27: 1 via ORAL
  Filled 2020-08-27: qty 1

## 2020-08-27 NOTE — Plan of Care (Signed)
Problem: Safety  Goal: Patient will be free from injury during hospitalization  Outcome: Progressing  Flowsheets (Taken 08/26/2020 0907 by Marylynn Pearson, RN)  Patient will be free from injury during hospitalization: Provide and maintain safe environment  Goal: Patient will be free from infection during hospitalization  Outcome: Progressing  Flowsheets (Taken 08/21/2020 2354 by Gabriela Eves, RN)  Free from Infection during hospitalization:   Assess and monitor for signs and symptoms of infection   Monitor lab/diagnostic results   Monitor all insertion sites (i.e. indwelling lines, tubes, urinary catheters, and drains)     Problem: Pain  Goal: Pain at adequate level as identified by patient  Outcome: Progressing  Flowsheets (Taken 08/26/2020 0907 by Marylynn Pearson, RN)  Pain at adequate level as identified by patient: Identify patient comfort function goal     Problem: Discharge Barriers  Goal: Patient will be discharged home or other facility with appropriate resources  Outcome: Progressing  Flowsheets (Taken 08/22/2020 1020 by Octaviano Batty, RN)  Discharge to home or other facility with appropriate resources:   Provide appropriate patient education   Initiate discharge planning     Problem: Compromised Tissue integrity  Goal: Damaged tissue is healing and protected  Outcome: Progressing  Flowsheets (Taken 08/27/2020 1101)  Damaged tissue is healing and protected:   Keep intact skin clean and dry   Provide wound care per wound care algorithm   Pt A&Ox4, VSS, meds tolerated well, insulin refused by pt, complains of no pain.  Pt states he will care for the wound on his toe himself with the solution prescribed.  Safety measures in place; bed in lowest position, call bell and bedside table within reach, room cleared of clutter.

## 2020-08-27 NOTE — Plan of Care (Signed)
Problem: Safety  Goal: Patient will be free from injury during hospitalization  Outcome: Progressing  Flowsheets (Taken 08/26/2020 0907 by Marylynn Pearson, RN)  Patient will be free from injury during hospitalization: Provide and maintain safe environment     Problem: Pain  Goal: Pain at adequate level as identified by patient  Outcome: Progressing  Flowsheets (Taken 08/26/2020 0907 by Marylynn Pearson, RN)  Pain at adequate level as identified by patient: Identify patient comfort function goal     Problem: Side Effects from Pain Analgesia  Goal: Patient will experience minimal side effects of analgesic therapy  Outcome: Progressing  Flowsheets (Taken 08/27/2020 0333)  Patient will experience minimal side effects of analgesic therapy:   Monitor/assess patient's respiratory status (RR depth, effort, breath sounds)   Prevent/manage side effects per LIP orders (i.e. nausea, vomiting, pruritus, constipation, urinary retention, etc.)   Evaluate for opioid-induced sedation with appropriate assessment tool (i.e. POSS)   Assess for changes in cognitive function     Problem: Compromised Tissue integrity  Goal: Damaged tissue is healing and protected  Outcome: Progressing  Flowsheets (Taken 08/26/2020 0907 by Marylynn Pearson, RN)  Damaged tissue is healing and protected:   Reposition patient every 2 hours and as needed unless able to reposition self   Relieve pressure to bony prominences for patients at moderate and high risk  Goal: Nutritional status is improving  Outcome: Progressing  Flowsheets (Taken 08/22/2020 1020 by Octaviano Batty, RN)  Nutritional status is improving:   Allow adequate time for meals   Encourage patient to take dietary supplement(s) as ordered   Include patient/patient care companion in decisions related to nutrition   Collaborate with Clinical Nutritionist

## 2020-08-27 NOTE — Consults (Signed)
Psychiatry Progress Note    Patient name:  Daniel Hebert, Daniel Hebert  Date of admission:  08/21/2020  Date of birth:  1960/06/18                       Date of consultation:  08/27/2020  Age:  60 y.o.                                     Attending/Referring Physician:  Drue Dun, MD  MRN:  [id number]                                   Consulting Physician:  Violeta Gelinas, MD  CSN:  [id number]                                    Guardian/POA: Friend      Reason for Admission    Foot pain, cellulitis of lower extremity,    Active Hospital Problems    Diagnosis    Cellulitis of left lower extremity       Reason for Initial Consultation    Evaluation for capacity, refusing medication management and care, patient is found to have denies in his room    I. History     Interval History  Daniel Hebert is a 60 y.o. 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 Right Foot OM, DM Type II, HTN, HLD, Hx. Meningioma, GERD who presented with Right Foot Pain.     Patient reports for the past 4-5 days he has had progressive redness, pain, and swelling of his right foot. He has had chronic foot ulcer and recurrent infections in the past and has been evaluated by many different Podiatrists and Ortho Ankle/Foot physicians recently. He had MRI recently which showed chronic stable changes related to the foot and no acute OM or septic arthritis. The patient is determined not to undergo any amputations and has poor opinion of Podiatry as a field stating that they have mismanaged his condition. He was seen by Foot/Ankle Ortho Dr. Mosetta Putt who recommended coming to the hospital for IV Abx and Podiatry evaluation. In the ED, hemodynamically stable, started on Vanc+Zosyn and admitted for further management. On my discussion with the patient, he was very resistant to having Podiatry consult, but on further discussion, has agreed to evaluation by them in the AM.  Patient reportedly drove to this area from  [place] with his dog    Patient reportedly drove from [place] to this area with his dog in the car left the dog in the car and heart son of couple days while he is in the hospital.  The dog is subsequently rescued and currently in the pond.  Police charges are pending     BM:[id number] smoking tobacco, drinking alcohol, or using illicit drugs.  His ex-wife lives in his    A psychiatric consult was initiated as patient is refusing treatment angry agitated paranoid delusional agitated demanding argumentative and a capacity evaluation is recommended.  Patient is willing to take IV antibiotics  Does not want any p.o. medications for diabetes mellitus, hypertension, any other issues    Medical history is significant for hypertensive cardiovascular disorder, right lower extremity  cellulitis, infection, osteomyelitis, diabetes mellitus, hypercholesterolemia, history of brain tumor /meningioma, GERD, poor self-care    08/27/2020  Interval history  Patient is alert oriented to person place and time  Appeared to be disorganized argumentative  Eating well sleeping well  Appeared to have multiple complaints disorganized paranoid  Patient is not suicidal not homicidal cooperative contract for safety  Patient is difficult disorganized argumentative    Chief Complaint:   Mood swings impaired insight and judgment, agitation paranoia severe thought disorder impaired insight and judgment unable to care for himself  Additional History and Collateral Information:   No new additional history and collateral information could be obtained at this time  Medical Review of Systems  Poorly cooperative  All other pertinent Review of Systems negative.    Allergies   Allergen Reactions    Molds & Smuts      difficulity breathing     Current Facility-Administered Medications   Medication Dose Route Frequency    amLODIPine  10 mg Oral Daily    cadexomer iodine   Topical Daily    docusate sodium  100 mg Oral Daily    heparin (porcine)   5,000 Units Subcutaneous Q12H SCH    insulin glargine  20 Units Subcutaneous QHS    insulin lispro  1-3 Units Subcutaneous QHS    insulin lispro  1-5 Units Subcutaneous TID AC    insulin lispro  3 Units Subcutaneous Once    piperacillin-tazobactam  4.5 g Intravenous Q8H    polyethylene glycol  17 g Oral Daily         II. Examination   Vital signs reviewed:   Blood pressure (!) 160/92, pulse 96, temperature 97.5 F (36.4 C), temperature source Oral, resp. rate 16, height 1.905 m (6\' 3" ), weight 100.7 kg (222 lb), SpO2 96 %.           The physical exam performed by the medical team reviewed.    Laboratory Data and Diagnostic Studies  Lab Results last 48 Hours       Procedure Component Value Units Date/Time    Glucose Whole Blood - POCT [[id number]]  (Abnormal) Collected: 08/23/20 1113     Updated: 08/23/20 1118     Whole Blood Glucose POCT 313 mg/dL     Magnesium [[id number]] Collected: 08/23/20 0618    Specimen: Blood Updated: 08/23/20 0811     Magnesium 1.9 mg/dL     Comprehensive metabolic panel [[id number]]  (Abnormal) Collected: 08/23/20 0618    Specimen: Blood Updated: 08/23/20 0811     Glucose 274 mg/dL      BUN 11 mg/dL      Creatinine 0.9 mg/dL      Calcium 9.0 mg/dL      Sodium 366 mEq/L      Potassium 4.3 mEq/L      Chloride 105 mEq/L      CO2 22 mEq/L      Anion Gap 8.0     Protein, Total 6.3 g/dL      Albumin 3.0 g/dL      AST (SGOT) 18 U/L      ALT 30 U/L      Alkaline Phosphatase 81 U/L      Bilirubin, Total 0.7 mg/dL      Globulin 3.3 g/dL      Albumin/Globulin Ratio 0.9    GFR [[id number]] Collected: 08/23/20 0618     Updated: 08/23/20 0811     EGFR >60.0       Glucose Whole Blood -  POCT [[id number]]  (Abnormal) Collected: 08/23/20 0729     Updated: 08/23/20 0744     Whole Blood Glucose POCT 247 mg/dL     CBC without differential [[id number]]  (Abnormal) Collected: 08/23/20 0618    Specimen: Blood Updated: 08/23/20 0651     WBC 4.15 x10 3/uL      Hgb 13.3 g/dL      Hematocrit 05.3 %      Platelets 231 x10  3/uL      RBC 4.63 x10 6/uL      MCV 86.4 fL      MCH 28.7 pg      MCHC 33.3 g/dL      RDW 12 %      MPV 8.7 fL      Nucleated RBC 0.0 /100 WBC      Absolute NRBC 0.00 x10 3/uL     Culture Blood Aerobic and Anaerobic [[id number]] Collected: 08/21/20 1809    Specimen: Arm from Blood, Venipuncture Updated: 08/23/20 0121    Narrative:      ORDER#: [id number]                                    ORDERED BY: Kelby Aline  SOURCE: Blood, Venipuncture Arm                      COLLECTED:  08/21/20 18:09  ANTIBIOTICS AT COLL.:                                RECEIVED :  08/22/20 00:57  Culture Blood Aerobic and Anaerobic        PRELIM      08/23/20 01:21  08/23/20   No Growth after 1 day/s of incubation.      Culture Blood Aerobic and Anaerobic [[id number]] Collected: 08/21/20 1736    Specimen: Arm from Blood, Venipuncture Updated: 08/23/20 0121    Narrative:      ORDER#: [id number]                                    ORDERED BY: Kelby Aline  SOURCE: Blood, Venipuncture Arm                      COLLECTED:  08/21/20 17:36  ANTIBIOTICS AT COLL.:                                RECEIVED :  08/22/20 00:57  Culture Blood Aerobic and Anaerobic        PRELIM      08/23/20 01:21  08/23/20   No Growth after 1 day/s of incubation.      MRSA culture [[id number]] Collected: 08/22/20 2133    Specimen: Culturette from Nasal Swab Updated: 08/23/20 0014    MRSA culture [[id number]] Collected: 08/22/20 2133    Specimen: Culturette from Throat Updated: 08/23/20 0014    Glucose Whole Blood - POCT [[id number]]  (Abnormal) Collected: 08/22/20 2107     Updated: 08/22/20 2122     Whole Blood Glucose POCT 329 mg/dL     Glucose Whole Blood - POCT [[id number]]  (Abnormal) Collected: 08/22/20 1527     Updated: 08/22/20 1543     Whole  Blood Glucose POCT 303 mg/dL     Hemoglobin U9W [[id number]]  (Abnormal) Collected: 08/22/20 0609    Specimen: Blood Updated: 08/22/20 0945     Hemoglobin A1C 8.7 %      Average Estimated Glucose 203.0 mg/dL     Glucose Whole Blood -  POCT [[id number]]  (Abnormal) Collected: 08/22/20 0749     Updated: 08/22/20 0818     Whole Blood Glucose POCT 224 mg/dL     Basic Metabolic Panel [[id number]]  (Abnormal) Collected: 08/22/20 0609    Specimen: Blood Updated: 08/22/20 0749     Glucose 217 mg/dL      BUN 13 mg/dL      Creatinine 0.8 mg/dL      Calcium 9.0 mg/dL      Sodium 284 mEq/L      Potassium 3.8 mEq/L      Chloride 104 mEq/L      CO2 22 mEq/L      Anion Gap 10.0    Magnesium [[id number]] Collected: 08/22/20 0609    Specimen: Blood Updated: 08/22/20 0749     Magnesium 1.9 mg/dL     GFR [[id number]] Collected: 08/22/20 0609     Updated: 08/22/20 0749     EGFR >60.0       CBC without differential [[id number]] Collected: 08/22/20 0609    Specimen: Blood Updated: 08/22/20 0722     WBC 5.08 x10 3/uL      Hgb 13.1 g/dL      Hematocrit 38.7 %      Platelets 219 x10 3/uL      RBC 4.58 x10 6/uL      MCV 86.2 fL      MCH 28.6 pg      MCHC 33.2 g/dL      RDW 13 %      MPV 8.9 fL      Nucleated RBC 0.0 /100 WBC      Absolute NRBC 0.00 x10 3/uL     PT/APTT [[id number]]  (Abnormal) Collected: 08/22/20 0609     Updated: 08/22/20 0720     PT 15.6 sec      PT INR 1.3     PTT 27 sec     Glucose Whole Blood - POCT [[id number]]  (Abnormal) Collected: 08/21/20 2127     Updated: 08/21/20 2143     Whole Blood Glucose POCT 342 mg/dL     Sedimentation rate (ESR) [[id number]]  (Abnormal) Collected: 08/21/20 1736    Specimen: Blood Updated: 08/21/20 1802     Sed Rate 38 mm/Hr     Comprehensive metabolic panel [[id number]]  (Abnormal) Collected: 08/21/20 1736    Specimen: Blood Updated: 08/21/20 1801     Glucose 301 mg/dL      BUN 21 mg/dL      Creatinine 1.1 mg/dL      Sodium 542 mEq/L      Potassium 4.0 mEq/L      Chloride 100 mEq/L      CO2 25 mEq/L      Calcium 9.9 mg/dL      Protein, Total 7.2 g/dL      Albumin 3.5 g/dL      AST (SGOT) 25 U/L      ALT 40 U/L      Alkaline Phosphatase 99 U/L      Bilirubin, Total 0.4 mg/dL      Globulin 3.7 g/dL      Albumin/Globulin Ratio 0.9      Anion Gap 11.0  GFR [[id number]] Collected: 08/21/20 1736     Updated: 08/21/20 1801     EGFR >60.0       Lactic Acid [[id number]] Collected: 08/21/20 1735    Specimen: Blood Updated: 08/21/20 1753     Lactic Acid 1.4 mmol/L     Narrative:      Cancel second order for specimen if the initial level is less  than 2.70mEq/L    CBC and differential [[id number]]  (Abnormal) Collected: 08/21/20 1736    Specimen: Blood Updated: 08/21/20 1746     WBC 5.20 x10 3/uL      Hgb 14.5 g/dL      Hematocrit 62.9 %      Platelets 236 x10 3/uL      RBC 4.92 x10 6/uL      MCV 86.0 fL      MCH 29.5 pg      MCHC 34.3 g/dL      RDW 12 %      MPV 8.7 fL      Neutrophils 64.9 %      Lymphocytes Automated 18.1 %      Monocytes 13.7 %      Eosinophils Automated 1.9 %      Basophils Automated 0.8 %      Immature Granulocytes 0.6 %      Nucleated RBC 0.0 /100 WBC      Neutrophils Absolute 3.38 x10 3/uL      Lymphocytes Absolute Automated 0.94 x10 3/uL      Monocytes Absolute Automated 0.71 x10 3/uL      Eosinophils Absolute Automated 0.10 x10 3/uL      Basophils Absolute Automated 0.04 x10 3/uL      Immature Granulocytes Absolute 0.03 x10 3/uL      Absolute NRBC 0.00 x10 3/uL           Last EKG Result       None          No results found for any visits on 08/21/20.    III. Assessment and Plan (Medical Decision Making)       Messiyah Waterson is a 60 y.o. 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 Right Foot OM, DM Type II, HTN, HLD, Hx. Meningioma, GERD who presented with Right Foot Pain.     Patient reports for the past 4-5 days he has had progressive redness, pain, and swelling of his right foot. He has had chronic foot ulcer and recurrent infections in the past and has been evaluated by many different Podiatrists and Ortho Ankle/Foot physicians recently. He had MRI recently which showed chronic stable changes related to the foot and no acute OM or septic arthritis. The patient is determined not to  undergo any amputations and has poor opinion of Podiatry as a field stating that they have mismanaged his condition. He was seen by Foot/Ankle Ortho Dr. Mosetta Putt who recommended coming to the hospital for IV Abx and Podiatry evaluation. In the ED, hemodynamically stable, started on Vanc+Zosyn and admitted for further management. On my discussion with the patient, he was very resistant to having Podiatry consult, but on further discussion, has agreed to evaluation by them in the AM.    Patient reportedly drove from [place] to this area with his dog in the car left the dog in the car and heart son of couple days while he is in the hospital.  The dog is subsequently rescued and currently in  the pond.  Police charges are pending     GM:WNUUVO smoking tobacco, drinking alcohol, or using illicit drugs.  His ex-wife lives in his    A psychiatric consult was initiated as patient is refusing treatment angry agitated paranoid delusional agitated demanding argumentative and a capacity evaluation is recommended.  Patient is willing to take IV antibiotics  Does not want any p.o. medications for diabetes mellitus, hypertension, any other issues    Medical history is significant for hypertensive cardiovascular disorder, right lower extremity cellulitis, infection, osteomyelitis, diabetes mellitus, hypercholesterolemia, history of brain tumor /meningioma, GERD, poor self-care  MRI of the foot indicates     IMPRESSION:  1.  Osseous erosion and fragmentation of the second toe distal phalanx  with abnormal marrow edema in the second toe middle and distal  phalanges, compatible with osteomyelitis.  2.  Degenerative changes of the midfoot with adjacent osseous erosions  and extensive marrow edema/enhancement. 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  3.  Diffuse subcutaneous edema and mild enhancement throughout the foot,  especially in the  second toe, compatible with cellulitis. No discrete  abscess or drainable fluid collection is identified.     These urgent results were discussed with and acknowledged by Burnett Kanaris, DO, on 08/22/2020 at 2:50 PM.   Carla Drape, MD   08/22/2020 2:50 PM     Knives were discovered in his room and were removed by the hospital staff    Well-developed single Caucasian male  Rapid pressured speech anxious angry irritable  Severe thought disorder was noted  Patient denies suicidal homicidal ideations cooperative contract for safety  Patient is not a suicidal risk not a homicidal risk at this time  Patient denies hearing voices denies suicidal homicidal ideations denies any dangerousness at this time  Patient is willing to take IV antibiotics however patient is refusing p.o. 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  Axis IV moderate  Axis V 20-25    Disposition Recommendation:     Medication management  Medical  stabilization  Supportive care  Patient is cooperative and willing to take IV antibiotics at this time  However patient is refusing to take p.o. medication for hypertensive cardiovascular disorder or diabetes mellitus at this time, patient states that he does not believe in the p.o. medications and insulin  May discharge AMA when medically stable  If patient needs involuntary medication management may consider a medical TDO for involuntary treatment and medication management  Patient is not a suicidal risk not a homicidal risk at this time  patient may be discharged when medically stable  Patient has a court order to appear in the court on July 27th as he left the dog [place] the car in [place]  Patient has no knives in his room at this time patient is stable safe contract for safety  If situation should change may consider TDO evaluation for inpatient psychiatric treatment?  Case management services and discharge plans recommended      Signed by: Violeta Gelinas, MD  08/27/2020  5:43 PM    *This note was generated by the Epic EMR system/ Dragon speech recognition and may contain inherent errors or omissions not intended by the user. Grammatical errors, random word insertions, deletions, pronoun errors and incomplete sentences are occasional consequences of this technology due to software limitations. Not all errors are caught or corrected. If there are questions or concerns about the content of this note or information contained within the body of this dictation they should be addressed directly with the author for clarification

## 2020-08-27 NOTE — Progress Notes (Signed)
Pt refused all insulin today, was told risks of not being administered insulin and states "it is poison" and he believes it caused the wound to his second toe on the rt foot.  Pt performed wound care to his foot on his own, did not want RN to perform for him.

## 2020-08-27 NOTE — Progress Notes (Signed)
[facility]  HOSPITALIST  PROGRESS NOTE      Patient: Daniel Hebert  Date: 08/27/2020   LOS: 6 Days  Admission Date: 08/21/2020   MRN: [id number]  Attending: Drue Dun, MD       ASSESSMENT/PLAN     Daniel Hebert is a 60 y.o. male admitted with below       Osteomyelitis and Diabetic ulcer right second toe, cellulitis right foot, Charcot osteoarthropathy midfoot RT:  - Erythema and cellulitis improved  - Regarding his toe osteomyelitis, patient is still not decided between surgery versus long-term IV antibiotics.  He keeps going back and forth.  --If he agrees to amputation, then will attempt to find a surgeon who will agree to do the surgery.  Patient did not agree with Dr. Alanda Slim who was trying to explain to him his treatment options and patient had requested that Dr. Alanda Slim does not see him anymore.  - He feels that herbal supplements will heal his foot  - Continue with IV vancomycin and Zosyn per ID recs.  Wound culture positive for Enterococcus faecalis  - Arterial duplex negative    Uncontrolled DM: Refuses to take medications including oral or insulin.. Reports he takes herbs. A1C 8.7    Uncontrolled HTN: initially was refusing amlodipine, now agreeable  Concern for psychiatric disorder: He seems to understand his disease process and he seems to understand different options that he has and he seems to understand possible consequences.  I believe patient has capacity to make his own decision although he is making decision that could not be his best interest.  He is agreeing to using the IV antibiotics for now and he is willing to think about the amputation but would like to give antibiotics a chance, fixated about hyperbaric chamber.  Psych evaluated patient and agreed he is competent to make his own decisions.    History of meningioma: Outpatient follow-up  Stable hyponatremia likely due to hyperglycemia    Dispo: Patient needs to make up his mind either IV antibiotics for about 6 weeks or agreed to  the amputation.  Discussed with case management.  If he decides on IV antibiotics then disposition plan to be initiated.        Patient Lines/Drains/Airways Status       Active PICC Line / CVC Line / PIV Line / Drain / Airway / Intraosseous Line / Epidural Line / ART Line / Line / Wound / Pressure Ulcer / NG/OG Tube       Name Placement date Placement time Site Days    Peripheral IV 08/21/20 20 G Standard Left Antecubital 08/21/20  1730  Antecubital  1                             Care Plan discussed with nursing, and case manager       Doing okay  Subjective: No pain  Feels ok  MEDICATIONS     Current Facility-Administered Medications   Medication Dose Route Frequency    amLODIPine  10 mg Oral Daily    cadexomer iodine   Topical Daily    docusate sodium  100 mg Oral Daily    heparin (porcine)  5,000 Units Subcutaneous Q12H SCH    insulin glargine  20 Units Subcutaneous QHS    insulin lispro  1-3 Units Subcutaneous QHS    insulin lispro  1-5 Units Subcutaneous TID AC    insulin lispro  3  Units Subcutaneous Once    piperacillin-tazobactam  4.5 g Intravenous Q8H    polyethylene glycol  17 g Oral Daily    vancomycin  1,500 mg Intravenous Q12H    vancomycin   Intravenous See Admin Instructions       ROS       Neurological: No headache or dizziness  Respiratory: No cough or shortness of breath  Cardiovascular: No chest pain      PHYSICAL EXAM     Vitals:    08/27/20 1250   BP: 133/84   Pulse: 91   Resp: 18   Temp: 98.2 F (36.8 C)   SpO2: 96%       Temperature: Temp  Min: 97.3 F (36.3 C)  Max: 98.2 F (36.8 C)  Pulse: Pulse  Min: 72  Max: 95  Respiratory: Resp  Min: 16  Max: 18  Non-Invasive BP: BP  Min: 133/84  Max: 155/81  Pulse Oximetry SpO2  Min: 92 %  Max: 96 %    Intake and Output Summary (Last 24 hours) at Date Time  No intake or output data in the 24 hours ending 08/27/20 1607      GEN APPEARANCE: Alert and oriented   CVS: RRR, no gallup  LUNGS: CTAB; No Wheezes; No Rhonchi: No rales  Vascular: Dorsalis pedis  pulses are palpable  ABD: Soft; No TTP  EXT: Right foot erythema improved, 2nd toe with ulcer  NEURO: CN 2-12 intact; No Focal neurological deficits.  Gait was also assessed and it is intact.  Psych: Patient is cooperative, positive some paranoid and grandiose thoughts but no hallucination or delusions    LABS     Recent Labs   Lab 08/27/20  0524 08/26/20  0709 08/25/20  0800   WBC 6.28 6.22 5.58   RBC 5.01 5.14 5.20   Hgb 14.8 15.5 14.9   Hematocrit 43.1 44.0 45.1   MCV 86.0 85.6 86.7   Platelets 300 312 304         Recent Labs   Lab 08/27/20  0524 08/26/20  0709 08/25/20  0800 08/24/20  0635 08/23/20  0618   Sodium 134* 133* 135* 136 135*   Potassium 5.0 4.9 4.9 4.7 4.3   Chloride 102 104 104 104 105   CO2 23 20* 25 25 22    BUN 15 12 14 12 11    Creatinine 1.1 0.9 1.1 1.0 0.9   Glucose 282* 270* 291* 214* 274*   Calcium 9.4 9.8 9.9 9.3 9.0   Magnesium 1.8 1.9 2.0 1.9 1.9         Recent Labs   Lab 08/24/20  0635 08/23/20  0618 08/21/20  1736   ALT 53 30 40   AST (SGOT) 46* 18 25   Bilirubin, Total 0.8 0.7 0.4   Bilirubin Direct 0.2  --   --    Albumin 3.1* 3.0* 3.5   Alkaline Phosphatase 82 81 99               Recent Labs   Lab 08/22/20  0609   PT INR 1.3*   PT 15.6*   PTT 27         Microbiology Results (last 15 days)       Procedure Component Value Units Date/Time    Microbiology additional request [[id number]] Collected: 08/25/20 0952    Order Status: Completed Specimen: Blood from Wound Updated: 08/25/20 0957    Narrative:      [id number] - Ecoli sensitivities, thanks.  ORDER#: [id number]                                    ORDERED BY: Rayetta Humphrey, TREVOR  SOURCE: Wound wound                                  COLLECTED:  08/25/20 09:52  ANTIBIOTICS AT COLL.:                                RECEIVED :  08/25/20 09:52  Microbiology Add On Request                FINAL       08/25/20 09:57  08/25/20   For results, see #[id number]      Culture + Gram Azzie Glatter Wound [[id number]] Collected: 08/23/20 1724    Order Status:  Completed Specimen: Wound from Abrasion Updated: 08/27/20 1553    Narrative:      ORDER#: [id number]                                    ORDERED BY: Elise Benne  SOURCE: Abrasion R 2nd toe                           COLLECTED:  08/23/20 17:24  ANTIBIOTICS AT COLL.:                                RECEIVED :  08/23/20 22:27  Stain, Gram                                FINAL       08/24/20 00:32   +  08/24/20   Few WBCs             No Squamous epithelial cells seen             Many Gram positive cocci  Culture and Gram Stain, Aerobic, Wound     FINAL       08/27/20 15:53   +  08/24/20   Moderate growth of mixed cutaneous flora  08/27/20   Light growth of Escherichia coli      08/26/20   Moderate growth of Enterococcus faecalis      08/27/20   Very light growth of Klebsiella pneumoniae      _____________________________________________________________________________                                     E.coli        E.faecalis     K.pneumoniae    ANTIBIOTICS                     MIC  INTRP      MIC  INTRP      MIC  INTRP      _____________________________________________________________________________  Amoxicillin/CA                 <=4/2   S                       <=  4/2   S        Ampicillin                      <=4    S         2     S        >16    R        Ampicillin/sulbactam            4/2    S                        8/4    S        Aztreonam                       <=2    S                        <=2    S        Cefazolin                        2     S                         2     S        Cefepime                        <=1    S                        <=1    S        Cefoxitin                       <=4    S                        <=4    S        Ceftazidime                     <=2    S                        <=2    S        Ceftriaxone                     <=1    S                        <=1    S        Cefuroxime                      <=4    S                        <=4    S        Ciprofloxacin                  <=0.25   S                      <=  0.25   S        Ertapenem                     <=0.25   S                      <=0.25   S        Gentamicin                      <=2    S                        <=2    S        Gentamicin High Level Resistan                 <=500   S                        Levofloxacin                   <=0.5   S                       <=0.5   S        Meropenem                      <=0.5   S                       <=0.5   S        Penicillin                                       4     S                        Piperacillin/Tazobactam        <=2/4   S                        4/4    S        Streptomycin High Level Resist                <=1000   S                        Tetracycline                    <=2    S                        <=2    S        Trimethoprim/Sulfamethoxazole <=0.5/9  S                      <=0.5/9  S        Vancomycin                                       1     S  _____________________________________________________________________________            S=SUSCEPTIBLE     I=INTERMEDIATE     R=RESISTANT                            N/S=NON-SUSCEPTIBLE  _____________________________________________________________________________      Additional Culture Request [[id number]] Collected: 08/23/20 1724    Order Status: Completed Specimen: Abrasion Updated: 08/25/20 0956    Narrative:      ORDER#: [id number]                                    ORDERED BY: Elise Benne  SOURCE: Abrasion R 2nd toe                           COLLECTED:  08/23/20 17:24  ANTIBIOTICS AT COLL.:                                RECEIVED :  08/23/20 22:27  Additional Culture Request                 FINAL       08/25/20 09:56  08/25/20   E. coli susceptibility requested by physician:             Dr. Fredia Beets on Order [phone]      MRSA culture [[id number]] Collected: 08/22/20 2133    Order Status: Completed Specimen: Culturette from Nasal Swab Updated: 08/23/20 2038     Culture MRSA Surveillance  Negative for Methicillin Resistant Staph aureus    MRSA culture [[id number]] Collected: 08/22/20 2133    Order Status: Completed Specimen: Culturette from Throat Updated: 08/23/20 2038     Culture MRSA Surveillance Negative for Methicillin Resistant Staph aureus    Culture Blood Aerobic and Anaerobic [[id number]] Collected: 08/21/20 1809    Order Status: Completed Specimen: Arm from Blood, Venipuncture Updated: 08/27/20 0321    Narrative:      ORDER#: [id number]                                    ORDERED BY: Kelby Aline  SOURCE: Blood, Venipuncture Arm                      COLLECTED:  08/21/20 18:09  ANTIBIOTICS AT COLL.:                                RECEIVED :  08/22/20 00:57  Culture Blood Aerobic and Anaerobic        FINAL       08/27/20 03:21  08/27/20   No growth after 5 days of incubation.      Culture Blood Aerobic and Anaerobic [[id number]] Collected: 08/21/20 1736    Order Status: Completed Specimen: Arm from Blood, Venipuncture Updated: 08/27/20 0321    Narrative:      ORDER#: [id number]                                    ORDERED BY: Kelby Aline  SOURCE: Blood, Venipuncture Arm  COLLECTED:  08/21/20 17:36  ANTIBIOTICS AT COLL.:                                RECEIVED :  08/22/20 00:57  Culture Blood Aerobic and Anaerobic        FINAL       08/27/20 03:21  08/27/20   No growth after 5 days of incubation.               RADIOLOGY     Radiological Procedure personally reviewed and concur with radiologist reports unless stated otherwise.    US Arterial / Graft Duplex Doppler Lower Extremity Right   Final Result       1. Right lower extremity: No arterial insufficiency of the right lower   extremity at rest with triphasic distal tibial waveforms and normal   waveforms of the right second toe.   2. Left lower extremity: No arterial insufficiency of the left lower   extremity at rest.      Adelina Mings, MD    08/24/2020 10:05 AM      [place] Noninvas Low Extrem Art Dopp/Press/Wavefrms (PVR) Comp 3-4  Levels   Final Result       1. Right lower extremity: No arterial insufficiency of the right lower   extremity at rest with triphasic distal tibial waveforms and normal   waveforms of the right second toe.   2. Left lower extremity: No arterial insufficiency of the left lower   extremity at rest.      Adelina Mings, MD    08/24/2020 10:05 AM      MRI Foot Right W WO Contrast   Final Result      1.  Osseous erosion and fragmentation of the second toe distal phalanx   with abnormal marrow edema in the second toe middle and distal   phalanges, compatible with osteomyelitis.      2.  Degenerative changes of the midfoot with adjacent osseous erosions   and extensive marrow edema/enhancement. These findings are likely due to   Charcot arthropathy, although superimposed infection is in region is   difficult to exclude.      3.  Diffuse subcutaneous edema and mild enhancement throughout the foot,   especially in the second toe, compatible with cellulitis. No discrete   abscess or drainable fluid collection is identified.      These urgent results were discussed with and acknowledged by Burnett Kanaris, DO, on 08/22/2020 at 2:50 PM.      Carla Drape, MD    08/22/2020 2:50 PM      Foot Right AP Lateral And Oblique   Final Result         1.  Abnormal lucency, erosion, and fragmentation of the second toe   distal phalanx, most likely representing osteomyelitis with superimposed   pathologic fracture. There is pronounced soft tissue swelling of the   second toe and dorsal foot.      2.  Abnormal lateral subluxation of the second TMT joint and, to a   slightly lesser extent, third and fourth TMT joints, concerning for   Lisfranc ligamentous injury. There are associated degenerative changes   and ossification along the dorsal midfoot, which may be reflective of   developing Charcot arthropathy.      3.  NOTIFICATION: The critical findings of this study were discussed   with, and acknowledged by, Dr. Kelby Aline  via telephone at 6:53  PM on   08/21/2020.      Vassie Moment, MD    08/21/2020 6:54 PM          Signed,  Drue Dun, MD  4:07 PM 08/27/2020

## 2020-08-27 NOTE — Progress Notes (Addendum)
Pt refused all bedtime insulin. Blood sugar was 342.     Pt's BP was elevated 183/96 and 175/112. Pt has PRN Hydralazine to treat elevated BP. Pt refused treatment.        MD notified and aware about high blood sugar and BP.

## 2020-08-27 NOTE — Progress Notes (Signed)
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       Extremely poor historian, tangential thought process  Poor insight into medical issues  Room search today: 2 knives found  Noncompliant with traditional medicines    Admitted with chronic right forefoot swelling (at least since 01/2020)  Worsening of right second toe swelling, wound ~03/2020  1 week Hx of progressive swelling, redness, drainage, nail has fallen off    On admission afebrile, markedly elevated BP (180s/120s  Normal WBC  ESR 38  Normal lactate  Normal WBC  X-ray & osteomyelitis C/W right second toe osteomyelitis & Charcot foot & pathologic fracture right second toe    Plan:     1.  Right second toe osteomyelitis with pathologic fracture., swelling, ulceration, right second toe osteomyelitis  -Patient believes his herbal salve & iv abx will save his foot  - stated insulin is going to destroy his foot  -Continues to decline surgery.  -Patient is convinced another few days of ABX will heal his foot.  -Explained to him that the swelling and soft tissue is slightly better but this is not adequate to treat the bone/chronic osteo-and he ultimately needs an amputation.  -I was firm and telling the patient that he needs an amputation for a cure  -I explained to him that once abx  stopped, this will recur  -Patient will re discussed amputation with hospitalist later today.  -Wound CX with E.  Faecalis, Klebsiella & E. coli.  -MRSA screen negative  -[place] Vanco  -Continue Zosyn  -He is NOT a good candidate for PICC line, I strongly feel he cannot manage  IV ABX or a PICC line  -It is not clear where he lives patient travels from [place] to [facility], has mentioned to staff that he does not update his address due to garment).  -Should he decide to leave AMA can send with Rx for doxycycline 100 q12 & Augmentin 875/125Q 12 HR      2.  PSYCH.  -Declined psych eval initially, but seen by psych late last week.  -Patient with tangential thought process, poor insight into medical health, pressured speech & difficult to redirect/reorient to pertinent conversation  -Please see psych note  -Most of today's visit was again patient sharing  grievances about prior providers    My visit today was in the presence of the bedside nurse Alejandra & security guard     will follow  peripherally       Signed by: Golden Circle, MD,   Pager [phone]  Epic chat / group epic chat [facility] [id number]  Office [phone]    Subjective:     Afebrile, BP elevated, but better after taking Norvasc (previously refused)  Refusing therapies  Continues to use his herbal salve/ointment over his toe.  Denied N/V, F/C, cough, dyspnea, rashes, diarrhea    Review of Systems:       As  above    Physical Exam:     Vitals:    08/27/20 1250   BP: 133/84   Pulse: 91   Resp: 18   Temp: 98.2 F (36.8 C)   SpO2: 96% RA     TMAX: 98.2    GEN: NAD, sitting upright in bed, talking continuously  HEENT: AT/NC, anicteric sclera  CV: RRR  PULM: CTA bilaterally  ABD: Soft, NT, NABS, no G/R  EXT: No BLE edema see pictures below with erythema slightly better, hyperpigmentation over dorsal foot  DERM: No rashes  NEURO: No facial droop  PSYCH: Pressured speech, tangential conversation.  Difficult to redirect.  Next          Labs:         Recent Labs   Lab 08/27/20  0524 08/26/20  0709 08/25/20  0800   WBC 6.28 6.22 5.58   RBC 5.01 5.14 5.20   Hgb 14.8 15.5 14.9   Hematocrit 43.1 44.0 45.1   MCV 86.0 85.6 86.7   MCHC 34.3 35.2 33.0   RDW 12 12 12    MPV 8.3* 8.3* 8.3*   Platelets 300 312 304       Recent Labs   Lab 08/27/20  0524 08/26/20  0709  08/25/20  0800 08/24/20  0635 08/23/20  0618   Sodium 134* 133* 135* 136 135*   Potassium 5.0 4.9 4.9 4.7 4.3   Chloride 102 104 104 104 105   CO2 23 20* 25 25 22    BUN 15 12 14 12 11    Creatinine 1.1 0.9 1.1 1.0 0.9   Glucose 282* 270* 291* 214* 274*   Calcium 9.4 9.8 9.9 9.3 9.0   Magnesium 1.8 1.9 2.0 1.9 1.9       Recent Labs   Lab 08/24/20  0635 08/23/20  0618 08/21/20  1736   ALT 53 30 40   AST (SGOT) 46* 18 25   Bilirubin, Total 0.8 0.7 0.4   Bilirubin Direct 0.2  --   --    Albumin 3.1* 3.0* 3.5   Alkaline Phosphatase 82 81 99       MICRO  7/21 wound CX E. coli, E faecalis, Klebsiella  7/20 MRSA nares, throat negative  7/20 blood CX NGTD    Rads:     No results found.        ---------------------------------------------------------------------------------------  Signed by: [place], [id number]

## 2020-08-28 LAB — CBC
Absolute NRBC: 0 10*3/uL (ref 0.00–0.00)
Hematocrit: 43.6 % (ref 37.6–49.6)
Hgb: 14.9 g/dL (ref 12.5–17.1)
MCH: 29.6 pg (ref 25.1–33.5)
MCHC: 34.2 g/dL (ref 31.5–35.8)
MCV: 86.5 fL (ref 78.0–96.0)
MPV: 8.3 fL — ABNORMAL LOW (ref 8.9–12.5)
Nucleated RBC: 0 /100 WBC (ref 0.0–0.0)
Platelets: 333 10*3/uL (ref 142–346)
RBC: 5.04 10*6/uL (ref 4.20–5.90)
RDW: 13 % (ref 11–15)
WBC: 6.42 10*3/uL (ref 3.10–9.50)

## 2020-08-28 LAB — BASIC METABOLIC PANEL
Anion Gap: 8 (ref 5.0–15.0)
BUN: 13 mg/dL (ref 9–28)
CO2: 24 mEq/L (ref 22–29)
Calcium: 9.9 mg/dL (ref 8.5–10.5)
Chloride: 101 mEq/L (ref 100–111)
Creatinine: 1.2 mg/dL (ref 0.7–1.3)
Glucose: 277 mg/dL — ABNORMAL HIGH (ref 70–100)
Potassium: 5.7 mEq/L — ABNORMAL HIGH (ref 3.5–5.1)
Sodium: 133 mEq/L — ABNORMAL LOW (ref 136–145)

## 2020-08-28 LAB — GLUCOSE WHOLE BLOOD - POCT
Whole Blood Glucose POCT: 306 mg/dL — ABNORMAL HIGH (ref 70–100)
Whole Blood Glucose POCT: 323 mg/dL — ABNORMAL HIGH (ref 70–100)
Whole Blood Glucose POCT: 370 mg/dL — ABNORMAL HIGH (ref 70–100)
Whole Blood Glucose POCT: 382 mg/dL — ABNORMAL HIGH (ref 70–100)

## 2020-08-28 LAB — MAGNESIUM: Magnesium: 2 mg/dL (ref 1.6–2.6)

## 2020-08-28 LAB — POTASSIUM: Potassium: 4.7 mEq/L (ref 3.5–5.1)

## 2020-08-28 LAB — GFR: EGFR: >60

## 2020-08-28 MED ORDER — IBUPROFEN 400 MG PO TABS
400.0000 mg | ORAL_TABLET | Freq: Once | ORAL | Status: AC
Start: 2020-08-28 — End: 2020-08-28
  Administered 2020-08-28: 400 mg via ORAL
  Filled 2020-08-28: qty 1

## 2020-08-28 NOTE — Plan of Care (Addendum)
Pt A/Ox4. VSS. Pt ambulates independently in the room. Refused wound dressing change. Pt prefers to do it. Pt refused all bedtime insulin. pt's blood sugar was 342. Pt's BP was elevated 183/96 and 175/112. Pt has PRN Hydralazine to treat elevated BP. Pt refused treatment. MD notified and aware about high blood sugar and BP. All scheduled antibiotics administered. Will continue to monitor.       Problem: Safety  Goal: Patient will be free from injury during hospitalization  Outcome: Progressing  Flowsheets (Taken 08/26/2020 0907 by Marylynn Pearson, RN)  Patient will be free from injury during hospitalization: Provide and maintain safe environment  Goal: Patient will be free from infection during hospitalization  Outcome: Progressing  Flowsheets (Taken 08/21/2020 2354 by Gabriela Eves, RN)  Free from Infection during hospitalization:   Assess and monitor for signs and symptoms of infection   Monitor lab/diagnostic results   Monitor all insertion sites (i.e. indwelling lines, tubes, urinary catheters, and drains)     Problem: Pain  Goal: Pain at adequate level as identified by patient  Outcome: Progressing  Flowsheets (Taken 08/26/2020 0907 by Marylynn Pearson, RN)  Pain at adequate level as identified by patient: Identify patient comfort function goal     Problem: Compromised Tissue integrity  Goal: Damaged tissue is healing and protected  Outcome: Progressing  Flowsheets (Taken 08/27/2020 1101 by Marnee Guarneri, RN)  Damaged tissue is healing and protected:   Keep intact skin clean and dry   Provide wound care per wound care algorithm  Goal: Nutritional status is improving  Outcome: Progressing  Flowsheets (Taken 08/22/2020 1020 by Octaviano Batty, RN)  Nutritional status is improving:   Allow adequate time for meals   Encourage patient to take dietary supplement(s) as ordered   Include patient/patient care companion in decisions related to nutrition   Collaborate with Clinical Nutritionist     Problem: Diabetes: Glucose  Imbalance  Goal: Blood glucose stable at established goal  Outcome: Not Progressing  Flowsheets (Taken 08/22/2020 1020 by Octaviano Batty, RN)  Blood glucose stable at established goal:   Monitor lab values   Ensure patient/family has adequate teaching materials   Ensure adequate hydration   Ensure appropriate consults are obtained (Nutrition, Diabetes Education, and Case Management)   Assess for hypoglycemia /hyperglycemia   Monitor/assess vital signs   Ensure appropriate diet and assess tolerance   Include patient/family in decisions related to nutrition/dietary selections   Monitor intake and output.  Notify LIP if urine output is < 30 mL/hour.

## 2020-08-28 NOTE — Plan of Care (Signed)
BRIEF  ID NOTE:    Chart reviewed, afebrile, BP elevated at times.  Refusing interventions, certain medications, dressing changes.  Per nursing staff still refusing amputation  Awaiting Ortho opinion    Final wound CX updated: Light E. coli, E faecalis & light Klebsiella.  -Currently on Zosyn  -On discharge can switch to Augmentin 875/15 MG PO Q12 x 5 additional weeks ( to complete a  6 weeks)  -As previously mentioned patient is not a good candidate for IV ABX given poor hygiene, uncertain living situation, & poor medical insight  - I expressed to him yesterday  that 6 weeks of iv abx is not going to cure this chronic osteomyelitis & that amputation is his best chance of cure    D/W Bedside & Dr Elodia Florence    Golden Circle, MD

## 2020-08-28 NOTE — Progress Notes (Addendum)
Neuro: A x O = 4   Respiratory: Room air  Integumentary: intact except 2nd toe R foot  GI/GU: C x 2  Diet: cons carb  Activity: independent  LDA:     Patient Lines/Drains/Airways Status       Active Lines, Drains and Airways       Name Placement date Placement time Site Days    Peripheral IV 08/25/20 20 G Posterior;Right Forearm 08/25/20  1142  Forearm  3    Peripheral IV 08/27/20 18 G Left;Anterior Forearm 08/27/20  1300  Forearm  1                    Shift Events: gave ABX and Laxatives. declined insulin today. Patient concerned with getting letter for court appearance tomorrow, getting in touch with podiatrist that he gave to MD. Continue to  monitor    Vitals:    08/28/20 1727   BP: (!) 175/91   Pulse: (!) 106   Resp: 18   Temp: 98.1 F (36.7 C)   SpO2: 98%

## 2020-08-28 NOTE — Plan of Care (Signed)
Problem: Safety  Goal: Patient will be free from injury during hospitalization  Flowsheets (Taken 08/26/2020 0907 by Marylynn Pearson, RN)  Patient will be free from injury during hospitalization: Provide and maintain safe environment     Problem: Compromised Tissue integrity  Goal: Damaged tissue is healing and protected  Flowsheets (Taken 08/27/2020 1101 by Marnee Guarneri, RN)  Damaged tissue is healing and protected:   Keep intact skin clean and dry   Provide wound care per wound care algorithm

## 2020-08-28 NOTE — Progress Notes (Signed)
[facility]  HOSPITALIST  PROGRESS NOTE      Patient: Daniel Hebert  Date: 08/28/2020   LOS: 7 Days  Admission Date: 08/21/2020   MRN: [id number]  Attending: Marry Guan, MD       ASSESSMENT/PLAN     Lino Wickliff is a 59 y.o. male admitted with below       Osteomyelitis and Diabetic ulcer right second toe, cellulitis right foot, Charcot osteoarthropathy midfoot RT:  - Erythema and cellulitis improved  - Consulted with podiatrist, Dr. Allena Katz, for second opinion will see in a.m., patient agreeable for now.  Does not want to consult again with Dr. Alanda Slim.  Had extensive conversation with him today.  Also called orthopedist, Dr Orlene Och who initially referred him to the ED, agree with podiatrist evaluation while hospitalized.  Other option is AMA which patient is not inclined to do at this point .  - He feels that herbal supplements will heal his foot  - Continue with IV vancomycin and Zosyn per ID recs.  Wound culture positive for Enterococcus faecalis  - Arterial duplex negative    Uncontrolled DM: Blood sugar elevated.  Refuses to take medications including oral or insulin.. Reports he takes herbs. A1C 8.7    Uncontrolled HTN: Still elevated.  Initially was refusing amlodipine, now agreeable    Possible medical non compliance, Concern for psychiatric disorder: Seen by psychiatry, deemed capable of making his own decisions.  Multiple grievances to staff and treatment team.  Continue supportive care as possible.  Follow-up psychiatry as needed .    History of meningioma: Outpatient follow-up  Stable hyponatremia likely due to hyperglycemia  7.    Medical non compliance    Dispo: Pending goals of care to be established tomorrow after podiatry evaluation      Patient Lines/Drains/Airways Status       Active PICC Line / CVC Line / PIV Line / Drain / Airway / Intraosseous Line / Epidural Line / ART Line / Line / Wound / Pressure Ulcer / NG/OG Tube       Name Placement date Placement time Site Days    Peripheral IV  08/21/20 20 G Standard Left Antecubital 08/21/20  1730  Antecubital  1                             Care Plan discussed with nursing, and case manager       Patient seen and examined today at bedside with staff including nurse coordinator, treatment nurse.  Patient sharing multiple grievances, agitated.  Otherwise no acute complaints    MEDICATIONS     Current Facility-Administered Medications   Medication Dose Route Frequency    amLODIPine  10 mg Oral Daily    cadexomer iodine   Topical Daily    docusate sodium  100 mg Oral Daily    heparin (porcine)  5,000 Units Subcutaneous Q12H SCH    insulin glargine  20 Units Subcutaneous QHS    insulin lispro  1-3 Units Subcutaneous QHS    insulin lispro  1-5 Units Subcutaneous TID AC    insulin lispro  3 Units Subcutaneous Once    piperacillin-tazobactam  4.5 g Intravenous Q8H    polyethylene glycol  17 g Oral Daily       ROS       Neurological: No headache or dizziness  Respiratory: No cough or shortness of breath  Cardiovascular: No chest pain  PHYSICAL EXAM     Vitals:    08/28/20 0753   BP: 131/83   Pulse: 96   Resp: 17   Temp: 97.3 F (36.3 C)   SpO2: 98%       Temperature: Temp  Min: 97.3 F (36.3 C)  Max: 98.2 F (36.8 C)  Pulse: Pulse  Min: 66  Max: 96  Respiratory: Resp  Min: 14  Max: 18  Non-Invasive BP: BP  Min: 131/83  Max: 183/96  Pulse Oximetry SpO2  Min: 94 %  Max: 100 %    Intake and Output Summary (Last 24 hours) at Date Time  No intake or output data in the 24 hours ending 08/28/20 0816      GEN APPEARANCE: Alert and oriented   CVS: RRR,  LUNGS: CTAB; No Wheezes; No Rhonchi: No rales  Vascular: Dorsalis pedis pulses are palpable  ABD: Soft; No TTP  EXT: Right foot erythema improved, with  sloughing of skin around 2nd toe with ulcer, scattered bleeding.  NEURO: CN 2-12 intact; No Focal neurological deficits.  Gait was also assessed and it is intact.  Psych: Patient is cooperative, positive some paranoid and grandiose thoughts but no hallucination or  delusions    LABS     Recent Labs   Lab 08/28/20  0547 08/27/20  0524 08/26/20  0709   WBC 6.42 6.28 6.22   RBC 5.04 5.01 5.14   Hgb 14.9 14.8 15.5   Hematocrit 43.6 43.1 44.0   MCV 86.5 86.0 85.6   Platelets 333 300 312       Recent Labs   Lab 08/28/20  0547 08/27/20  0524 08/26/20  0709 08/25/20  0800 08/24/20  0635   Sodium 133* 134* 133* 135* 136   Potassium 5.7* 5.0 4.9 4.9 4.7   Chloride 101 102 104 104 104   CO2 24 23 20* 25 25   BUN 13 15 12 14 12    Creatinine 1.2 1.1 0.9 1.1 1.0   Glucose 277* 282* 270* 291* 214*   Calcium 9.9 9.4 9.8 9.9 9.3   Magnesium 2.0 1.8 1.9 2.0 1.9       Recent Labs   Lab 08/24/20  0635 08/23/20  0618 08/21/20  1736   ALT 53 30 40   AST (SGOT) 46* 18 25   Bilirubin, Total 0.8 0.7 0.4   Bilirubin Direct 0.2  --   --    Albumin 3.1* 3.0* 3.5   Alkaline Phosphatase 82 81 99             Recent Labs   Lab 08/22/20  0609   PT INR 1.3*   PT 15.6*   PTT 27       Microbiology Results (last 15 days)       Procedure Component Value Units Date/Time    Microbiology additional request [[id number]] Collected: 08/25/20 0952    Order Status: Completed Specimen: Blood from Wound Updated: 08/25/20 0957    Narrative:      [id number] - Ecoli sensitivities, thanks.  ORDER#: [id number]                                    ORDERED BY: Lavona Mound  SOURCE: Wound wound  COLLECTED:  08/25/20 09:52  ANTIBIOTICS AT COLL.:                                RECEIVED :  08/25/20 09:52  Microbiology Add On Request                FINAL       08/25/20 09:57  08/25/20   For results, see #[id number]      Culture + Gram Azzie Glatter Wound [[id number]] Collected: 08/23/20 1724    Order Status: Completed Specimen: Wound from Abrasion Updated: 08/27/20 1553    Narrative:      ORDER#: [id number]                                    ORDERED BY: Elise Benne  SOURCE: Abrasion R 2nd toe                           COLLECTED:  08/23/20 17:24  ANTIBIOTICS AT COLL.:                                 RECEIVED :  08/23/20 22:27  Stain, Gram                                FINAL       08/24/20 00:32   +  08/24/20   Few WBCs             No Squamous epithelial cells seen             Many Gram positive cocci  Culture and Gram Stain, Aerobic, Wound     FINAL       08/27/20 15:53   +  08/24/20   Moderate growth of mixed cutaneous flora  08/27/20   Light growth of Escherichia coli      08/26/20   Moderate growth of Enterococcus faecalis      08/27/20   Very light growth of Klebsiella pneumoniae      _____________________________________________________________________________                                     E.coli        E.faecalis     K.pneumoniae    ANTIBIOTICS                     MIC  INTRP      MIC  INTRP      MIC  INTRP      _____________________________________________________________________________  Amoxicillin/CA                 <=4/2   S                       <=4/2   S        Ampicillin                      <=4    S         2     S        >16  R        Ampicillin/sulbactam            4/2    S                        8/4    S        Aztreonam                       <=2    S                        <=2    S        Cefazolin                        2     S                         2     S        Cefepime                        <=1    S                        <=1    S        Cefoxitin                       <=4    S                        <=4    S        Ceftazidime                     <=2    S                        <=2    S        Ceftriaxone                     <=1    S                        <=1    S        Cefuroxime                      <=4    S                        <=4    S        Ciprofloxacin                 <=0.25   S                      <=0.25   S        Ertapenem                     <=0.25   S                      <=  0.25   S        Gentamicin                      <=2    S                        <=2    S        Gentamicin High Level Resistan                 <=500   S                         Levofloxacin                   <=0.5   S                       <=0.5   S        Meropenem                      <=0.5   S                       <=0.5   S        Penicillin                                       4     S                        Piperacillin/Tazobactam        <=2/4   S                        4/4    S        Streptomycin High Level Resist                <=1000   S                        Tetracycline                    <=2    S                        <=2    S        Trimethoprim/Sulfamethoxazole <=0.5/9  S                      <=0.5/9  S        Vancomycin                                       1     S                        _____________________________________________________________________________            S=SUSCEPTIBLE     I=INTERMEDIATE     R=RESISTANT  N/S=NON-SUSCEPTIBLE  _____________________________________________________________________________      Additional Culture Request [[id number]] Collected: 08/23/20 1724    Order Status: Completed Specimen: Abrasion Updated: 08/25/20 0956    Narrative:      ORDER#: [id number]                                    ORDERED BY: Elise Benne  SOURCE: Abrasion R 2nd toe                           COLLECTED:  08/23/20 17:24  ANTIBIOTICS AT COLL.:                                RECEIVED :  08/23/20 22:27  Additional Culture Request                 FINAL       08/25/20 09:56  08/25/20   E. coli susceptibility requested by physician:             Dr. Fredia Beets on Order [id number]      MRSA culture [[id number]] Collected: 08/22/20 2133    Order Status: Completed Specimen: Culturette from Nasal Swab Updated: 08/23/20 2038     Culture MRSA Surveillance Negative for Methicillin Resistant Staph aureus    MRSA culture [[id number]] Collected: 08/22/20 2133    Order Status: Completed Specimen: Culturette from Throat Updated: 08/23/20 2038     Culture MRSA Surveillance Negative for Methicillin Resistant Staph aureus    Culture Blood Aerobic and  Anaerobic [[id number]] Collected: 08/21/20 1809    Order Status: Completed Specimen: Arm from Blood, Venipuncture Updated: 08/27/20 0321    Narrative:      ORDER#: [id number]                                    ORDERED BY: Kelby Aline  SOURCE: Blood, Venipuncture Arm                      COLLECTED:  08/21/20 18:09  ANTIBIOTICS AT COLL.:                                RECEIVED :  08/22/20 00:57  Culture Blood Aerobic and Anaerobic        FINAL       08/27/20 03:21  08/27/20   No growth after 5 days of incubation.      Culture Blood Aerobic and Anaerobic [[id number]] Collected: 08/21/20 1736    Order Status: Completed Specimen: Arm from Blood, Venipuncture Updated: 08/27/20 0321    Narrative:      ORDER#: [id number]                                    ORDERED BY: Kelby Aline  SOURCE: Blood, Venipuncture Arm                      COLLECTED:  08/21/20 17:36  ANTIBIOTICS AT COLL.:  RECEIVED :  08/22/20 00:57  Culture Blood Aerobic and Anaerobic        FINAL       08/27/20 03:21  08/27/20   No growth after 5 days of incubation.               RADIOLOGY     Radiological Procedure personally reviewed and concur with radiologist reports unless stated otherwise.    US Arterial / Graft Duplex Doppler Lower Extremity Right   Final Result       1. Right lower extremity: No arterial insufficiency of the right lower   extremity at rest with triphasic distal tibial waveforms and normal   waveforms of the right second toe.   2. Left lower extremity: No arterial insufficiency of the left lower   extremity at rest.      Adelina Mings, MD    08/24/2020 10:05 AM      [place] Noninvas Low Extrem Art Dopp/Press/Wavefrms (PVR) Comp 3-4 Levels   Final Result       1. Right lower extremity: No arterial insufficiency of the right lower   extremity at rest with triphasic distal tibial waveforms and normal   waveforms of the right second toe.   2. Left lower extremity: No arterial insufficiency of the left lower   extremity at  rest.      Adelina Mings, MD    08/24/2020 10:05 AM      MRI Foot Right W WO Contrast   Final Result      1.  Osseous erosion and fragmentation of the second toe distal phalanx   with abnormal marrow edema in the second toe middle and distal   phalanges, compatible with osteomyelitis.      2.  Degenerative changes of the midfoot with adjacent osseous erosions   and extensive marrow edema/enhancement. These findings are likely due to   Charcot arthropathy, although superimposed infection is in region is   difficult to exclude.      3.  Diffuse subcutaneous edema and mild enhancement throughout the foot,   especially in the second toe, compatible with cellulitis. No discrete   abscess or drainable fluid collection is identified.      These urgent results were discussed with and acknowledged by Burnett Kanaris, DO, on 08/22/2020 at 2:50 PM.      Carla Drape, MD    08/22/2020 2:50 PM      Foot Right AP Lateral And Oblique   Final Result         1.  Abnormal lucency, erosion, and fragmentation of the second toe   distal phalanx, most likely representing osteomyelitis with superimposed   pathologic fracture. There is pronounced soft tissue swelling of the   second toe and dorsal foot.      2.  Abnormal lateral subluxation of the second TMT joint and, to a   slightly lesser extent, third and fourth TMT joints, concerning for   Lisfranc ligamentous injury. There are associated degenerative changes   and ossification along the dorsal midfoot, which may be reflective of   developing Charcot arthropathy.      3.  NOTIFICATION: The critical findings of this study were discussed   with, and acknowledged by, Dr. Kelby Aline via telephone at 6:53 PM on   08/21/2020.      Vassie Moment, MD    08/21/2020 6:54 PM          Signed,  Marry Guan, MD  8:16  AM 08/28/2020

## 2020-08-28 NOTE — Progress Notes (Signed)
[facility] Navigator Note    F/U call to discuss [facility] and HBO with pt.   Pt stated he would not let Alanda Slim treat him   Discussed need for targeted Abx therapy for extended time ( 4-6 weeks or more ) to qualify for HBO. Culture for targeted therapy would need a sharp debridement and culture of tissue and  bone based on recommendations of ID. Pt asked specifically where to take culture navigator shared this would be directed by ID.   Since pt no longer working with [facility] Attending will need to secure provider to perform sharp debridement and culture for ID to make final recommendations of ABx. Pt does not want IV therapy but explained that they may be only effective way to treat osteo effective but again deferred to ID.   Pt stated that wound and cellulitis better with Abx and topical therapy from [facility]. Discussed that there is no visual way to assess the bone infection and we know it is still there even if outward S & S are less.   Pt shared he was denied ability to go to car to get herbal medicine he believes is helping. Pt asked what could cause increase in potasium navigator looked up and one of the supplements he is on can raise potasium levels. Advised pt to be very cautious with medicines that could cause additional problems and may not be effective for what he is taking them for.   Also discussed DM and his concern about insulin. His HbA1c is high and he has only recently started taking a medicine he believes will cure diabetes. Navigator shared that insulin is usually what save and prevent complication in DM we agreed to disagree about the effectiveness of insulin.   Support given but navigator clear that immediate cure for osteo is amputation and if he does not want that he must follow ID recommendations to try to clear the bone infection         Yisroel Ramming RN, BSN  [facility] Navigator   [email or web address]  [phone]

## 2020-08-29 ENCOUNTER — Ambulatory Visit: Payer: Self-pay

## 2020-08-29 ENCOUNTER — Inpatient Hospital Stay: Payer: BC Managed Care – PPO | Admitting: Anesthesiology

## 2020-08-29 ENCOUNTER — Encounter
Admission: EM | Disposition: A | Discharge status: Home / Self Care | Payer: Self-pay | Source: Home / Self Care | Attending: Hospitalist

## 2020-08-29 HISTORY — PX: DEBRIDEMENT & IRRIGATION, LOWER EXTREMITY: SHX3682

## 2020-08-29 HISTORY — PX: BIOPSY, BONE: SHX3226

## 2020-08-29 LAB — GLUCOSE WHOLE BLOOD - POCT
Whole Blood Glucose POCT: 331 mg/dL — ABNORMAL HIGH (ref 70–100)
Whole Blood Glucose POCT: 303 mg/dL — ABNORMAL HIGH (ref 70–100)
Whole Blood Glucose POCT: 224 mg/dL — ABNORMAL HIGH (ref 70–100)
Whole Blood Glucose POCT: 222 mg/dL — ABNORMAL HIGH (ref 70–100)
Whole Blood Glucose POCT: 219 mg/dL — ABNORMAL HIGH (ref 70–100)
Whole Blood Glucose POCT: 375 mg/dL — ABNORMAL HIGH (ref 70–100)

## 2020-08-29 SURGERY — BIOPSY, BONE
Anesthesia: Anesthesia General | Site: Foot | Laterality: Right | Wound class: Dirty or Infected

## 2020-08-29 MED ORDER — ONDANSETRON HCL 4 MG/2ML IJ SOLN
INTRAMUSCULAR | Status: DC | PRN
Start: 2020-08-29 — End: 2020-08-29
  Administered 2020-08-29: 4 mg via INTRAVENOUS

## 2020-08-29 MED ORDER — LIDOCAINE HCL 1 % IJ SOLN
INTRAMUSCULAR | Status: AC
Start: 2020-08-29 — End: ?
  Filled 2020-08-29: qty 20

## 2020-08-29 MED ORDER — LACTATED RINGERS IV SOLN
INTRAVENOUS | Status: DC
Start: 2020-08-29 — End: 2020-09-03

## 2020-08-29 MED ORDER — MIDAZOLAM HCL 1 MG/ML IJ SOLN (WRAP)
INTRAMUSCULAR | Status: DC | PRN
Start: 2020-08-29 — End: 2020-08-29
  Administered 2020-08-29: 2 mg via INTRAVENOUS

## 2020-08-29 MED ORDER — FENTANYL CITRATE (PF) 50 MCG/ML IJ SOLN (WRAP)
INTRAMUSCULAR | Status: AC
Start: 2020-08-29 — End: ?
  Filled 2020-08-29: qty 2

## 2020-08-29 MED ORDER — BUPIVACAINE HCL 0.5 % IJ SOLN
INTRAMUSCULAR | Status: DC | PRN
Start: 2020-08-29 — End: 2020-08-29
  Administered 2020-08-29: 5 mL

## 2020-08-29 MED ORDER — BUPIVACAINE HCL (PF) 0.5 % IJ SOLN
INTRAMUSCULAR | Status: AC
Start: 2020-08-29 — End: ?
  Filled 2020-08-29: qty 30

## 2020-08-29 MED ORDER — SODIUM CHLORIDE 0.9% BAG (IRRIGATION USE)
INTRAVENOUS | Status: DC | PRN
Start: 2020-08-29 — End: 2020-08-29
  Administered 2020-08-29: 1000 mL

## 2020-08-29 MED ORDER — VANCOMYCIN HCL 500 MG IV SOLR
INTRAVENOUS | Status: AC
Start: 2020-08-29 — End: ?
  Filled 2020-08-29: qty 500

## 2020-08-29 MED ORDER — DIAZEPAM 2 MG PO TABS
2.0000 mg | ORAL_TABLET | Freq: Once | ORAL | Status: AC
Start: 2020-08-30 — End: 2020-08-29
  Administered 2020-08-29: 2 mg via ORAL
  Filled 2020-08-29: qty 1

## 2020-08-29 MED ORDER — PROPOFOL INFUSION 10 MG/ML
INTRAVENOUS | Status: DC | PRN
Start: 2020-08-29 — End: 2020-08-29
  Administered 2020-08-29: 140 ug/kg/min via INTRAVENOUS

## 2020-08-29 MED ORDER — VANCOMYCIN HCL 500 MG IV SOLR
INTRAVENOUS | Status: DC | PRN
Start: 2020-08-29 — End: 2020-08-29
  Administered 2020-08-29: 500 mg

## 2020-08-29 MED ORDER — MIDAZOLAM HCL 1 MG/ML IJ SOLN (WRAP)
INTRAMUSCULAR | Status: AC
Start: 2020-08-29 — End: ?
  Filled 2020-08-29: qty 2

## 2020-08-29 MED ORDER — PROPOFOL INFUSION 10 MG/ML
INTRAVENOUS | Status: DC | PRN
Start: 2020-08-29 — End: 2020-08-29
  Administered 2020-08-29: 50 mg via INTRAVENOUS

## 2020-08-29 MED ORDER — FENTANYL CITRATE (PF) 50 MCG/ML IJ SOLN (WRAP)
INTRAMUSCULAR | Status: DC | PRN
Start: 2020-08-29 — End: 2020-08-29
  Administered 2020-08-29: 50 ug via INTRAVENOUS

## 2020-08-29 MED ORDER — PROPOFOL 10 MG/ML IV EMUL (WRAP)
INTRAVENOUS | Status: AC
Start: 2020-08-29 — End: ?
  Filled 2020-08-29: qty 200

## 2020-08-29 MED ORDER — ACETAMINOPHEN 10 MG/ML IV SOLN
INTRAVENOUS | Status: AC
Start: 2020-08-29 — End: ?
  Filled 2020-08-29: qty 100

## 2020-08-29 MED ORDER — LIDOCAINE HCL 1 % IJ SOLN
INTRAMUSCULAR | Status: DC | PRN
Start: 2020-08-29 — End: 2020-08-29
  Administered 2020-08-29: 5 mL via SUBCUTANEOUS

## 2020-08-29 SURGICAL SUPPLY — 60 items
BANDAGE CMPR PLSTR CTTN PRCR 5YDX4IN LF (Procedure Accessories) ×1
BANDAGE PROCARE COMP L5 YD X W4 IN 2 CLIP FASTENER SLF CLSR PLSTR CTTN (Procedure Accessories) ×1 IMPLANT
BANDAGE PROCARE COMPRESSION L5 YD X W4 (Procedure Accessories) ×1
BLADE 15 CLASSIC CARBON STEEL TISSUE (Blade) ×2
BLADE 15 CLASSIC CARBON STEEL TISSUE SURGICAL (Blade) ×2 IMPLANT
BLADE SRG CBNSTL 15 BP RB-BCK LF STRL (Blade) ×2
COVER CAM LF STRL LEN DISP CLR (Procedure Accessories) ×2
COVER CAMERA LENS DISPOSABLE CLEAR STERILE LATEX FREE (Procedure Accessories) ×1 IMPLANT
DRESSING EMLSN OIL CURITY 3X8 (Dressing) ×2 IMPLANT
ELECTRODE ADULT PATIENT RETURN L9 FT REM POLYHESIVE ACRYLIC FOAM (Procedure Accessories) ×1 IMPLANT
ELECTRODE PATIENT RETURN L9 FT VALLEYLAB (Procedure Accessories) ×1
ELECTRODE PT RTN RM PHSV ACRL FM C30- LB (Procedure Accessories) ×1
GLOVE SRG 6.5 BGL SRG LTX STRL PF BEAD (Glove) ×1
GLOVE SRG PLISPRN 7 BGL PI INDCTR (Glove) ×1
GLOVE SURGICAL 6.5 BIOGEL SURGEONS (Glove) ×1
GLOVE SURGICAL 6.5 BIOGEL SURGEONS POWDER FREE BEAD CUFF MICRO ROUGHEN (Glove) ×1 IMPLANT
GLOVE SURGICAL 7 BIOGEL PI INDICATOR (Glove) ×1
GLOVE SURGICAL 7 BIOGEL PI INDICATOR UNDERGLOVE POWDER FREE SMOOTH (Glove) ×1 IMPLANT
HANDLE LGHT ASPN LF STRL SNPON (Procedure Accessories) ×1
HANDLE LGHT LF STRL ADP LGHT CNTRL + TCH (Other) ×1
HANDLE LIGHT ADAPTIVE LIGHT CONTROL PLUS (Other) ×1
HANDLE LIGHT ADAPTIVE LIGHT CONTROL PLUS TECHNOLOGY SNAP ON LENS TOUCH (Other) ×1 IMPLANT
HANDLE LIGHT SNAP ON ASPEN (Procedure Accessories) ×1 IMPLANT
KIT INFECTION CONTROL CUSTOM (Kits) ×2
KIT INFECTION CONTROL CUSTOM IFOH03 (Kits) ×1 IMPLANT
MANIFOLD SCT 2 STD NPTN 2 LF NS 4 PORT (Filter) ×1
MANIFOLD SUCTION 2 STANDARD 4 PORT (Filter) ×1
MANIFOLD SUCTION 2 STANDARD 4 PORT NEPTUNE 2 WASTE MANAGEMENT SYSTEM (Filter) ×1 IMPLANT
NEEDLE BIOPSY OD11 GA L4 IN JAMSHIDI (Needles)
NEEDLE BIOPSY OD11 GA L4 IN JAMSHIDI RAZOR SHARP BEVEL TAPER DISTAL (Needles) IMPLANT
NEEDLE BX JMSHD 11GA 4IN LF STRL RZR (Needles)
NEEDLE HPO SS PP RW BD 25GA 1.5IN LF (Needles) ×4 IMPLANT
PACK SRG LF STRL XTRMT DISP ~~LOC~~ (Pack) ×2
PACKING WND IFRM CTTN GZE CRTY 5YDX.25IN (Packing) ×2 IMPLANT
PAD ABD PVC CRTY 9X5IN LF STRL 3 LYR (Dressing) ×4 IMPLANT
PADDING CAST L4 YD X W3 IN UNDERCAST (Cast) ×1
PADDING CAST L4YD XW3IN UNDERCAST MILD STRETCH CHSV WEBRIL COTTON (Cast) ×1 IMPLANT
PADDING CST CTTN WBRL 4YDX3IN LF STRL (Cast) ×1
SOLUTION IRR LR 3L ARTHMTC LF PLS CNTNR (Irrigation Solutions) ×1
SOLUTION IRRIGATION LACTATED RINGERS (Irrigation Solutions) ×1
SOLUTION IRRIGATION LACTATED RINGERS 3000 ML PLASTIC CONTAINER (Irrigation Solutions) ×1 IMPLANT
SOLUTION PREP ANTISEPTIC SCREW TOP (Scrub Supplies) ×2
SOLUTION PREP ANTISEPTIC SCREW TOP BOTTLE LIQUID 7.5% PVP IODINE 4 OZ (Scrub Supplies) ×1 IMPLANT
SOLUTION PREP ANTISEPTIC SCRW BTTL LQD MDLN 10% PVP IODNE 4OZ DRK BRWN (Scrub Supplies) ×1 IMPLANT
SOLUTION PRP 10% PVP IOD 4OZ LF ANSEP (Scrub Supplies) ×1
SOLUTION PRP 7.5% PVP IOD 4OZ LF ANSEP (Scrub Supplies) ×1
SPONGE GAUZE L4 IN X W4 IN 12 PLY (Sponge) ×1 IMPLANT
SPONGE GZE PLS CTTN CRTY 4X4IN LF STRL (Sponge) ×1
SUTURE ABS 3-0 FS1 VCL 27IN BRD COAT UD (Suture) ×1
SUTURE COATED VICRYL 3-0 FS-1 L27 IN (Suture) ×1 IMPLANT
SYRINGE 10 ML GRADUATE NONPYROGENIC DEHP (Syringes, Needles) ×3
SYRINGE 10 ML GRADUATE NONPYROGENIC DEHP FREE PVC FREE LOK MEDICAL (Syringes, Needles) ×3 IMPLANT
SYRINGE MED 10ML LL LF STRL GRAD N-PYRG (Syringes, Needles) ×3
TRAY SKIN SCRUB L8 IN 6 WING 6 SPONGE STICK 2 TIP APPLICATOR DRY VINYL (Prep) ×1 IMPLANT
TRAY SKIN SCRUB MEDLINE L8 IN VINYL (Prep) ×1
TRAY SKN SCRB VNYL CTTN 8IN LF STRL 6 (Prep) ×1
TRAY SURGICAL EXTREMITY ~~LOC~~ (Pack) ×1 IMPLANT
WRAP CMPR POR CBN 5YDX4IN LF STRL SLFADH (Bandage) ×1
WRAP COMPRESSION L5 YD X W4 IN SELF (Bandage) ×1
WRAP COMPRESSION L5 YD X W4 IN SELF ADHERENT ELASTIC LIGHTWEIGHT HAND (Bandage) ×1 IMPLANT

## 2020-08-29 NOTE — Anesthesia Preprocedure Evaluation (Signed)
Relevant Problems   No relevant active problems       PSS Anesthesia Comments: Poor controled DM, BS  This AM 358, Now 223, BP poor controled  - Last BP  179/98 . MRSA - right toe,ECG - NSR . Echocardiogram - Ef - 55%. No  Aortic stenosis, aortic sclerosis        Anesthesia Plan    ASA 3     general                     intravenous induction   Detailed anesthesia plan: general IV  Monitors/Adjuncts: other    Post Op: other  Trial extubation is not planned.  Post op pain management: per surgeon    informed consent obtained    Plan discussed with CRNA.                   Signed by: Juanell Fairly, MD 08/29/20 5:19 PM

## 2020-08-29 NOTE — Plan of Care (Signed)
Problem: Diabetes: Glucose Imbalance  Goal: Blood glucose stable at established goal  Outcome: Not Progressing  Flowsheets (Taken 08/29/2020 0038 by Cherie Ouch, RN)  Blood glucose stable at established goal:   Monitor lab values   Include patient/family in decisions related to nutrition/dietary selections   Monitor/assess vital signs   Assess for hypoglycemia /hyperglycemia   Ensure adequate hydration   Ensure patient/family has adequate teaching materials     Problem: Safety  Goal: Patient will be free from injury during hospitalization  Outcome: Progressing  Flowsheets (Taken 08/29/2020 0038 by Cherie Ouch, RN)  Patient will be free from injury during hospitalization:   Assess patient's risk for falls and implement fall prevention plan of care per policy   Provide and maintain safe environment   Hourly rounding   Include patient/ family/ care giver in decisions related to safety  Goal: Patient will be free from infection during hospitalization  Outcome: Progressing  Flowsheets (Taken 08/21/2020 2354 by Gabriela Eves, RN)  Free from Infection during hospitalization:   Assess and monitor for signs and symptoms of infection   Monitor lab/diagnostic results   Monitor all insertion sites (i.e. indwelling lines, tubes, urinary catheters, and drains)     Problem: Discharge Barriers  Goal: Patient will be discharged home or other facility with appropriate resources  Outcome: Progressing  Flowsheets (Taken 08/22/2020 1020 by Octaviano Batty, RN)  Discharge to home or other facility with appropriate resources:   Provide appropriate patient education   Initiate discharge planning     Problem: Psychosocial and Spiritual Needs  Goal: Demonstrates ability to cope with hospitalization/illness  Outcome: Progressing  Flowsheets (Taken 08/29/2020 0038 by Cherie Ouch, RN)  Demonstrates ability to cope with hospitalizations/illness:   Encourage verbalization of feelings/concerns/expectations   Include patient/  patient care companion in decisions   Provide quiet environment     Problem: Compromised Tissue integrity  Goal: Damaged tissue is healing and protected  Outcome: Progressing  Flowsheets (Taken 08/29/2020 0038 by Cherie Ouch, RN)  Damaged tissue is healing and protected:   Increase activity as tolerated/progressive mobility   Keep intact skin clean and dry   Monitor patient's hygiene practices   Pt A&Ox4, VSS, meds tolerated well, complains of no pain.  Pt refuses insulin and heparin, notified pt of risks of not controlling his blood sugar.  Safety measures in place; bed in lowest position, call bell and bedside table within reach.

## 2020-08-29 NOTE — Plan of Care (Signed)
Problem: Safety  Goal: Patient will be free from injury during hospitalization  Outcome: Progressing  Flowsheets (Taken 08/29/2020 0038)  Patient will be free from injury during hospitalization:   Assess patient's risk for falls and implement fall prevention plan of care per policy   Provide and maintain safe environment   Hourly rounding   Include patient/ family/ care giver in decisions related to safety     Problem: Psychosocial and Spiritual Needs  Goal: Demonstrates ability to cope with hospitalization/illness  Outcome: Progressing  Flowsheets (Taken 08/29/2020 0038)  Demonstrates ability to cope with hospitalizations/illness:   Encourage verbalization of feelings/concerns/expectations   Include patient/ patient care companion in decisions   Provide quiet environment     Problem: Compromised Tissue integrity  Goal: Damaged tissue is healing and protected  Outcome: Progressing  Flowsheets (Taken 08/29/2020 0038)  Damaged tissue is healing and protected:   Increase activity as tolerated/progressive mobility   Keep intact skin clean and dry   Monitor patient's hygiene practices     Problem: Diabetes: Glucose Imbalance  Goal: Blood glucose stable at established goal  Flowsheets (Taken 08/29/2020 0038)  Blood glucose stable at established goal:   Monitor lab values   Include patient/family in decisions related to nutrition/dietary selections   Monitor/assess vital signs   Assess for hypoglycemia /hyperglycemia   Ensure adequate hydration   Ensure patient/family has adequate teaching materials

## 2020-08-29 NOTE — Anesthesia Postprocedure Evaluation (Signed)
Anesthesia Post Evaluation    Patient: Daniel Hebert    Procedure(s):  BONE BIOPSY OF SECOND TOE WITH C-ARM FLUOROSCOPY  INCISION AND DRAINAGE OF ULCER RIGHT FOOT    Anesthesia type: general    Last Vitals:   Vitals Value Taken Time   BP 122/75 08/29/20 1841   Temp 36.7 C (98.1 F) 08/29/20 1834   Pulse 76 08/29/20 1841   Resp 14 08/29/20 1834   SpO2 97 % 08/29/20 1834   Vitals shown include unvalidated device data.              Anesthesia Post Evaluation:     Patient Evaluated: PACU  Patient Participation: complete - patient participated  Level of Consciousness: awake  Pain Score: 0  Pain Management: adequate    Airway Patency: patent    Anesthetic complications: No      PONV Status: none    Cardiovascular status: stable  Respiratory status: face mask  Hydration status: stable        Signed by: Juanell Fairly, MD, 08/29/2020 6:41 PM

## 2020-08-29 NOTE — Plan of Care (Signed)
BRIEF  ID NOTE:    Chart reviewed, afebrile, BP elevated at times.  Refusing interventions, certain medications, dressing changes.  Per nursing staff still refusing amputation    Agreeable for I&D and bone biopsy --> later this afternoon    wound CXLight E. coli, E faecalis & light Klebsiella.  -Currently on Zosyn    -As previously mentioned patient is not a good candidate for outpt picc/ IV ABX given poor hygiene, uncertain living situation, & poor medical insight  - I expressed to hiim on prior visit that 6 weeks of iv abx is not going to cure this chronic osteomyelitis & that amputation is his best chance of cure    -if he leaves AMA can switch to Augmentin 875/15 MG PO Q12 x 5 additional weeks ( to complete a  6 weeks)    D/W Bedside nurse    [place], MD

## 2020-08-29 NOTE — Consults (Signed)
Psychiatry Progress Note    Patient name:  Daniel Hebert, Daniel Hebert  Date of admission:  08/21/2020  Date of birth:  [birth year]                       Date of consultation:  08/29/2020  Age:  60 y.o.                                     Attending/Referring Physician:  Marry Guan, MD  MRN:  [id number]                                   Consulting Physician:  Violeta Gelinas, MD  CSN:  [id number]                                    Guardian/POA: Friend      Reason for Admission    Foot pain, cellulitis of lower extremity,    Active Hospital Problems    Diagnosis    Cellulitis of left lower extremity       Reason for Initial Consultation    Evaluation for capacity, refusing medication management and care, patient is found to have denies in his room    I. History     Interval History  Daniel Hebert is a 60 y.o. 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 Right Foot OM, DM Type II, HTN, HLD, Hx. Meningioma, GERD who presented with Right Foot Pain.     Patient reports for the past 4-5 days he has had progressive redness, pain, and swelling of his right foot. He has had chronic foot ulcer and recurrent infections in the past and has been evaluated by many different Podiatrists and Ortho Ankle/Foot physicians recently. He had MRI recently which showed chronic stable changes related to the foot and no acute OM or septic arthritis. The patient is determined not to undergo any amputations and has poor opinion of Podiatry as a field stating that they have mismanaged his condition. He was seen by Foot/Ankle Ortho Dr. Mosetta Putt who recommended coming to the hospital for IV Abx and Podiatry evaluation. In the ED, hemodynamically stable, started on Vanc+Zosyn and admitted for further management. On my discussion with the patient, he was very resistant to having Podiatry consult, but on further discussion, has agreed to evaluation by them in the AM.  Patient reportedly drove to this area from  [place] with his dog    Patient reportedly drove from [place] to this area with his dog in the car left the dog in the car and heart son of couple days while he is in the hospital.  The dog is subsequently rescued and currently in the pond.  Police charges are pending     MV:[id number] smoking tobacco, drinking alcohol, or using illicit drugs.  His ex-wife lives in his    A psychiatric consult was initiated as patient is refusing treatment angry agitated paranoid delusional agitated demanding argumentative and a capacity evaluation is recommended.  Patient is willing to take IV antibiotics  Does not want any p.o. medications for diabetes mellitus, hypertension, any other issues    Medical history is significant for hypertensive cardiovascular disorder, right lower extremity cellulitis,  infection, osteomyelitis, diabetes mellitus, hypercholesterolemia, history of brain tumor /meningioma, GERD, poor self-care    08/29/2020  Interval history  Patient is alert oriented to person place and time  Appeared to be disorganized argumentative  Eating well sleeping well  Patient is scheduled for a surgical procedure today 08/29/2020  Appeared to have multiple complaints disorganized paranoid  Patient is not suicidal not homicidal cooperative contract for safety  Patient is difficult disorganized argumentative  Discharge plans and outpatient follow-up plans recommended when medically stable    Chief Complaint:   Mood swings impaired insight and judgment, agitation paranoia severe thought disorder impaired insight and judgment unable to care for himself  Additional History and Collateral Information:   No new additional history and collateral information could be obtained at this time  Medical Review of Systems  Poorly cooperative  All other pertinent Review of Systems negative.    Allergies   Allergen Reactions    Molds & Smuts      difficulity breathing     Current Facility-Administered Medications   Medication Dose  Route Frequency    amLODIPine  10 mg Oral Daily    cadexomer iodine   Topical Daily    docusate sodium  100 mg Oral Daily    heparin (porcine)  5,000 Units Subcutaneous Q12H SCH    insulin glargine  20 Units Subcutaneous QHS    insulin lispro  1-3 Units Subcutaneous QHS    insulin lispro  1-5 Units Subcutaneous TID AC    insulin lispro  3 Units Subcutaneous Once    piperacillin-tazobactam  4.5 g Intravenous Q8H    polyethylene glycol  17 g Oral Daily         II. Examination   Vital signs reviewed:   Blood pressure 125/79, pulse 84, temperature 98.1 F (36.7 C), temperature source Oral, resp. rate 16, height 1.905 m (6\' 3" ), weight 100.7 kg (222 lb), SpO2 98 %.           The physical exam performed by the medical team reviewed.    Laboratory Data and Diagnostic Studies  Lab Results last 48 Hours       Procedure Component Value Units Date/Time    Glucose Whole Blood - POCT [[id number]]  (Abnormal) Collected: 08/23/20 1113     Updated: 08/23/20 1118     Whole Blood Glucose POCT 313 mg/dL     Magnesium [[id number]] Collected: 08/23/20 0618    Specimen: Blood Updated: 08/23/20 0811     Magnesium 1.9 mg/dL     Comprehensive metabolic panel [[id number]]  (Abnormal) Collected: 08/23/20 0618    Specimen: Blood Updated: 08/23/20 0811     Glucose 274 mg/dL      BUN 11 mg/dL      Creatinine 0.9 mg/dL      Calcium 9.0 mg/dL      Sodium 086 mEq/L      Potassium 4.3 mEq/L      Chloride 105 mEq/L      CO2 22 mEq/L      Anion Gap 8.0     Protein, Total 6.3 g/dL      Albumin 3.0 g/dL      AST (SGOT) 18 U/L      ALT 30 U/L      Alkaline Phosphatase 81 U/L      Bilirubin, Total 0.7 mg/dL      Globulin 3.3 g/dL      Albumin/Globulin Ratio 0.9    GFR [[id number]] Collected: 08/23/20 0618  Updated: 08/23/20 0811     EGFR >60.0       Glucose Whole Blood - POCT [[id number]]  (Abnormal) Collected: 08/23/20 0729     Updated: 08/23/20 0744     Whole Blood Glucose POCT 247 mg/dL     CBC without differential [[id number]]  (Abnormal) Collected:  08/23/20 0618    Specimen: Blood Updated: 08/23/20 0651     WBC 4.15 x10 3/uL      Hgb 13.3 g/dL      Hematocrit 78.2 %      Platelets 231 x10 3/uL      RBC 4.63 x10 6/uL      MCV 86.4 fL      MCH 28.7 pg      MCHC 33.3 g/dL      RDW 12 %      MPV 8.7 fL      Nucleated RBC 0.0 /100 WBC      Absolute NRBC 0.00 x10 3/uL     Culture Blood Aerobic and Anaerobic [[id number]] Collected: 08/21/20 1809    Specimen: Arm from Blood, Venipuncture Updated: 08/23/20 0121    Narrative:      ORDER#: [id number]                                    ORDERED BY: Kelby Aline  SOURCE: Blood, Venipuncture Arm                      COLLECTED:  08/21/20 18:09  ANTIBIOTICS AT COLL.:                                RECEIVED :  08/22/20 00:57  Culture Blood Aerobic and Anaerobic        PRELIM      08/23/20 01:21  08/23/20   No Growth after 1 day/s of incubation.      Culture Blood Aerobic and Anaerobic [[id number]] Collected: 08/21/20 1736    Specimen: Arm from Blood, Venipuncture Updated: 08/23/20 0121    Narrative:      ORDER#: [id number]                                    ORDERED BY: Kelby Aline  SOURCE: Blood, Venipuncture Arm                      COLLECTED:  08/21/20 17:36  ANTIBIOTICS AT COLL.:                                RECEIVED :  08/22/20 00:57  Culture Blood Aerobic and Anaerobic        PRELIM      08/23/20 01:21  08/23/20   No Growth after 1 day/s of incubation.      MRSA culture [[id number]] Collected: 08/22/20 2133    Specimen: Culturette from Nasal Swab Updated: 08/23/20 0014    MRSA culture [[id number]] Collected: 08/22/20 2133    Specimen: Culturette from Throat Updated: 08/23/20 0014    Glucose Whole Blood - POCT [[id number]]  (Abnormal) Collected: 08/22/20 2107     Updated: 08/22/20 2122     Whole Blood Glucose POCT 329 mg/dL     Glucose Whole Blood -  POCT [[id number]]  (Abnormal) Collected: 08/22/20 1527     Updated: 08/22/20 1543     Whole Blood Glucose POCT 303 mg/dL     Hemoglobin N5A [[id number]]  (Abnormal) Collected:  08/22/20 0609    Specimen: Blood Updated: 08/22/20 0945     Hemoglobin A1C 8.7 %      Average Estimated Glucose 203.0 mg/dL     Glucose Whole Blood - POCT [[id number]]  (Abnormal) Collected: 08/22/20 0749     Updated: 08/22/20 0818     Whole Blood Glucose POCT 224 mg/dL     Basic Metabolic Panel [[id number]]  (Abnormal) Collected: 08/22/20 0609    Specimen: Blood Updated: 08/22/20 0749     Glucose 217 mg/dL      BUN 13 mg/dL      Creatinine 0.8 mg/dL      Calcium 9.0 mg/dL      Sodium 272 mEq/L      Potassium 3.8 mEq/L      Chloride 104 mEq/L      CO2 22 mEq/L      Anion Gap 10.0    Magnesium [[id number]] Collected: 08/22/20 0609    Specimen: Blood Updated: 08/22/20 0749     Magnesium 1.9 mg/dL     GFR [[id number]] Collected: 08/22/20 0609     Updated: 08/22/20 0749     EGFR >60.0       CBC without differential [[id number]] Collected: 08/22/20 0609    Specimen: Blood Updated: 08/22/20 0722     WBC 5.08 x10 3/uL      Hgb 13.1 g/dL      Hematocrit 18.8 %      Platelets 219 x10 3/uL      RBC 4.58 x10 6/uL      MCV 86.2 fL      MCH 28.6 pg      MCHC 33.2 g/dL      RDW 13 %      MPV 8.9 fL      Nucleated RBC 0.0 /100 WBC      Absolute NRBC 0.00 x10 3/uL     PT/APTT [[id number]]  (Abnormal) Collected: 08/22/20 0609     Updated: 08/22/20 0720     PT 15.6 sec      PT INR 1.3     PTT 27 sec     Glucose Whole Blood - POCT [[id number]]  (Abnormal) Collected: 08/21/20 2127     Updated: 08/21/20 2143     Whole Blood Glucose POCT 342 mg/dL     Sedimentation rate (ESR) [[id number]]  (Abnormal) Collected: 08/21/20 1736    Specimen: Blood Updated: 08/21/20 1802     Sed Rate 38 mm/Hr     Comprehensive metabolic panel [[id number]]  (Abnormal) Collected: 08/21/20 1736    Specimen: Blood Updated: 08/21/20 1801     Glucose 301 mg/dL      BUN 21 mg/dL      Creatinine 1.1 mg/dL      Sodium 831 mEq/L      Potassium 4.0 mEq/L      Chloride 100 mEq/L      CO2 25 mEq/L      Calcium 9.9 mg/dL      Protein, Total 7.2 g/dL      Albumin 3.5 g/dL      AST  (SGOT) 25 U/L      ALT 40 U/L      Alkaline Phosphatase 99 U/L      Bilirubin, Total 0.4 mg/dL      Globulin 3.7  g/dL      Albumin/Globulin Ratio 0.9     Anion Gap 11.0    GFR [[id number]] Collected: 08/21/20 1736     Updated: 08/21/20 1801     EGFR >60.0       Lactic Acid [[id number]] Collected: 08/21/20 1735    Specimen: Blood Updated: 08/21/20 1753     Lactic Acid 1.4 mmol/L     Narrative:      Cancel second order for specimen if the initial level is less  than 2.70mEq/L    CBC and differential [[id number]]  (Abnormal) Collected: 08/21/20 1736    Specimen: Blood Updated: 08/21/20 1746     WBC 5.20 x10 3/uL      Hgb 14.5 g/dL      Hematocrit 08.6 %      Platelets 236 x10 3/uL      RBC 4.92 x10 6/uL      MCV 86.0 fL      MCH 29.5 pg      MCHC 34.3 g/dL      RDW 12 %      MPV 8.7 fL      Neutrophils 64.9 %      Lymphocytes Automated 18.1 %      Monocytes 13.7 %      Eosinophils Automated 1.9 %      Basophils Automated 0.8 %      Immature Granulocytes 0.6 %      Nucleated RBC 0.0 /100 WBC      Neutrophils Absolute 3.38 x10 3/uL      Lymphocytes Absolute Automated 0.94 x10 3/uL      Monocytes Absolute Automated 0.71 x10 3/uL      Eosinophils Absolute Automated 0.10 x10 3/uL      Basophils Absolute Automated 0.04 x10 3/uL      Immature Granulocytes Absolute 0.03 x10 3/uL      Absolute NRBC 0.00 x10 3/uL           Last EKG Result       None          No results found for any visits on 08/21/20.    III. Assessment and Plan (Medical Decision Making)       Daniel Hebert is a 60 y.o. 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 Right Foot OM, DM Type II, HTN, HLD, Hx. Meningioma, GERD who presented with Right Foot Pain.     Patient reports for the past 4-5 days he has had progressive redness, pain, and swelling of his right foot. He has had chronic foot ulcer and recurrent infections in the past and has been evaluated by many different Podiatrists and Ortho Ankle/Foot physicians recently. He  had MRI recently which showed chronic stable changes related to the foot and no acute OM or septic arthritis. The patient is determined not to undergo any amputations and has poor opinion of Podiatry as a field stating that they have mismanaged his condition. He was seen by Foot/Ankle Ortho Dr. Mosetta Putt who recommended coming to the hospital for IV Abx and Podiatry evaluation. In the ED, hemodynamically stable, started on Vanc+Zosyn and admitted for further management. On my discussion with the patient, he was very resistant to having Podiatry consult, but on further discussion, has agreed to evaluation by them in the AM.    Patient reportedly drove from [place] to this area with his dog in the car left the dog in the car and heart  son of couple days while he is in the hospital.  The dog is subsequently rescued and currently in the pond.  Police charges are pending     [id number] smoking tobacco, drinking alcohol, or using illicit drugs.  His ex-wife lives in his    A psychiatric consult was initiated as patient is refusing treatment angry agitated paranoid delusional agitated demanding argumentative and a capacity evaluation is recommended.  Patient is willing to take IV antibiotics  Does not want any p.o. medications for diabetes mellitus, hypertension, any other issues    Medical history is significant for hypertensive cardiovascular disorder, right lower extremity cellulitis, infection, osteomyelitis, diabetes mellitus, hypercholesterolemia, history of brain tumor /meningioma, GERD, poor self-care  MRI of the foot indicates     IMPRESSION:  1.  Osseous erosion and fragmentation of the second toe distal phalanx  with abnormal marrow edema in the second toe middle and distal  phalanges, compatible with osteomyelitis.  2.  Degenerative changes of the midfoot with adjacent osseous erosions  and extensive marrow edema/enhancement. 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  3.  Diffuse subcutaneous edema and mild enhancement throughout the foot,  especially in the second toe, compatible with cellulitis. No discrete  abscess or drainable fluid collection is identified.     These urgent results were discussed with and acknowledged by Burnett Kanaris, DO, on 08/22/2020 at 2:50 PM.   Carla Drape, MD   08/22/2020 2:50 PM     Knives were discovered in his room and were removed by the hospital staff    Well-developed single Caucasian male  Rapid pressured speech anxious angry irritable  Severe thought disorder was noted  Patient denies suicidal homicidal ideations cooperative contract for safety  Patient is not a suicidal risk not a homicidal risk at this time  Patient denies hearing voices denies suicidal homicidal ideations denies any dangerousness at this time  Patient is willing to take IV antibiotics however patient is refusing p.o. 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  Axis IV moderate  Axis V 20-25    Disposition Recommendation:     Medication management  Medical stabilization  Supportive care  Patient is cooperative and willing to take IV antibiotics at this time  However patient is refusing to take p.o. medication for hypertensive cardiovascular disorder or diabetes mellitus at this time, patient states that he does not believe in the p.o. medications and insulin  Discharge plans and case management services recommended when medically  stable  If patient needs involuntary medication management may consider a medical TDO for involuntary treatment and medication management  Patient is not a suicidal risk not a homicidal risk at this time patient may be discharged when medically stable  Patient has a court order to appear in the court on July 27th as he left the dog in the car in [place]  Patient has no knives in his room at this time patient is stable safe contract for safety  If situation should change may consider TDO evaluation for inpatient psychiatric treatment?  Case management services and discharge plans recommended      Signed by: Violeta Gelinas, MD  08/29/2020  2:10 PM    *This note was generated by the Epic EMR system/ Dragon speech recognition and may contain inherent errors or omissions not intended by the user. Grammatical errors, random word insertions, deletions, pronoun errors and incomplete sentences are occasional consequences of this technology due to software limitations. Not all errors are caught or corrected. If there are questions or concerns about the content of this note or information contained within the body of this dictation they should be addressed directly with the author for clarification

## 2020-08-29 NOTE — Progress Notes (Signed)
Please keep patient NPO and will attempt to do bone biopsy of toe later today.  Full consult to follow.  Discussed surgical plans with Dr. Elodia Florence.

## 2020-08-29 NOTE — Progress Notes (Signed)
POD#0 Right foot incision and drainage with debridement down to deep fascia/ bone, bone biospy of second distal and intermediate phalanx packed open    -Keep right foot dressing dry, clean, and intact. Do not remove or get wet.  -Ice, elevate, offload right foot at rest.  -No dressing changes needed until Friday 08/31/20.  -Patient is willing to consider an amputation if his culture results are positive for infection. I told him I will tentatively add him on to the schedule for Friday afternoon if results are back or showing signs of bone infection.  -Continue IV antibiotics per ID.  -WBAT RLE in surgical shoe.  -Dr. Elodia Florence was updated after surgery about podiatry plans.

## 2020-08-29 NOTE — Progress Notes (Signed)
[facility]  HOSPITALIST  PROGRESS NOTE      Patient: Daniel Hebert  Date: 08/29/2020   LOS: 8 Days  Admission Date: 08/21/2020   MRN: [id number]  Attending: Marry Guan, MD       ASSESSMENT/PLAN     Daniel Hebert is a 60 y.o. male admitted with below       Osteomyelitis and Diabetic ulcer right second toe, cellulitis right foot, Charcot osteoarthropathy midfoot RT:  - Erythema and cellulitis improved  - Consulted with podiatrist, Dr. Allena Katz, patient agreeable for only a bone biopsy today.  Patient aware will likely need second surgical procedure if he  agrees.  -- We will follow pathology.   -- Continue current antibiotics with Zosyn.  Wound cultures with light growth of E. coli, E faecalis and Klebsiella  - Arterial duplex negative  Is a poor candidate for home IV antibiotics and as such if not decided on surgical option, will likely discharge on prolonged course of oral antibiotics.    Uncontrolled DM: Blood sugar elevated.  Refuses to take medications including oral or insulin.. Reports he takes herbs. A1C 8.7    Uncontrolled HTN: Continue amlodipine ,mild elevated but labile  Possible medical non compliance, Concern for psychiatric disorder: Seen by psychiatry, deemed capable of making his own decisions.  Multiple grievances to staff and treatment team.  Continue supportive care as possible.  Follow-up psychiatry as needed .    History of meningioma: Outpatient follow-up  Stable hyponatremia likely due to hyperglycemia  7.    Medical non compliance    Dispo: Likely home pending further decision on definitive treatment of osteomyelitis with surgical amputation      Patient Lines/Drains/Airways Status       Active PICC Line / CVC Line / PIV Line / Drain / Airway / Intraosseous Line / Epidural Line / ART Line / Line / Wound / Pressure Ulcer / NG/OG Tube       Name Placement date Placement time Site Days    Peripheral IV 08/21/20 20 G Standard Left Antecubital 08/21/20  1730  Antecubital  1                              Care Plan discussed with nursing, and case manager       Patient seen and examined today at bedside . no acute complaints    MEDICATIONS     Current Facility-Administered Medications   Medication Dose Route Frequency    amLODIPine  10 mg Oral Daily    cadexomer iodine   Topical Daily    docusate sodium  100 mg Oral Daily    heparin (porcine)  5,000 Units Subcutaneous Q12H SCH    insulin glargine  20 Units Subcutaneous QHS    insulin lispro  1-3 Units Subcutaneous QHS    insulin lispro  1-5 Units Subcutaneous TID AC    insulin lispro  3 Units Subcutaneous Once    piperacillin-tazobactam  4.5 g Intravenous Q8H    polyethylene glycol  17 g Oral Daily       ROS       Neurological: No headache or dizziness  Respiratory: No cough or shortness of breath  Cardiovascular: No chest pain      PHYSICAL EXAM     Vitals:    08/29/20 1159   BP: 125/79   Pulse: 84   Resp: 16   Temp: 98.1 F (36.7 C)  SpO2: 98%       Temperature: Temp  Min: 97.3 F (36.3 C)  Max: 98.1 F (36.7 C)  Pulse: Pulse  Min: 71  Max: 106  Respiratory: Resp  Min: 16  Max: 18  Non-Invasive BP: BP  Min: 125/79  Max: 175/91  Pulse Oximetry SpO2  Min: 97 %  Max: 99 %    Intake and Output Summary (Last 24 hours) at Date Time  No intake or output data in the 24 hours ending 08/29/20 1533      GEN APPEARANCE: Alert and oriented   CVS: RRR,  LUNGS: CTAB; No Wheezes; No Rhonchi: No rales  Vascular: Dorsalis pedis pulses are palpable  ABD: Soft; No TTP  EXT: Right foot erythema improved, with  sloughing of skin around 2nd toe with ulcer, scattered bleeding.  NEURO: CN 2-12 intact; No Focal neurological deficits.  Gait was also assessed and it is intact.  Psych: Patient is cooperative, positive some paranoid and grandiose thoughts but no hallucination or delusions    LABS     Recent Labs   Lab 08/28/20  0547 08/27/20  0524 08/26/20  0709   WBC 6.42 6.28 6.22   RBC 5.04 5.01 5.14   Hgb 14.9 14.8 15.5   Hematocrit 43.6 43.1 44.0   MCV 86.5 86.0 85.6    Platelets 333 300 312       Recent Labs   Lab 08/28/20  1003 08/28/20  0547 08/27/20  0524 08/26/20  0709 08/25/20  0800 08/24/20  0635   Sodium  --  133* 134* 133* 135* 136   Potassium 4.7 5.7* 5.0 4.9 4.9 4.7   Chloride  --  101 102 104 104 104   CO2  --  24 23 20* 25 25   BUN  --  13 15 12 14 12    Creatinine  --  1.2 1.1 0.9 1.1 1.0   Glucose  --  277* 282* 270* 291* 214*   Calcium  --  9.9 9.4 9.8 9.9 9.3   Magnesium  --  2.0 1.8 1.9 2.0 1.9       Recent Labs   Lab 08/24/20  0635 08/23/20  0618   ALT 53 30   AST (SGOT) 46* 18   Bilirubin, Total 0.8 0.7   Bilirubin Direct 0.2  --    Albumin 3.1* 3.0*   Alkaline Phosphatase 82 81                     Microbiology Results (last 15 days)       Procedure Component Value Units Date/Time    Microbiology additional request [[id number]] Collected: 08/25/20 0952    Order Status: Completed Specimen: Blood from Wound Updated: 08/25/20 0957    Narrative:      [id number] - Ecoli sensitivities, thanks.  ORDER#: [id number]                                    ORDERED BY: Lavona Mound  SOURCE: Wound wound                                  COLLECTED:  08/25/20 09:52  ANTIBIOTICS AT COLL.:  RECEIVED :  08/25/20 09:52  Microbiology Add On Request                FINAL       08/25/20 09:57  08/25/20   For results, see #[id number]      Culture + Gram Azzie Glatter Wound [[id number]] Collected: 08/23/20 1724    Order Status: Completed Specimen: Wound from Abrasion Updated: 08/27/20 1553    Narrative:      ORDER#: [id number]                                    ORDERED BY: Elise Benne  SOURCE: Abrasion R 2nd toe                           COLLECTED:  08/23/20 17:24  ANTIBIOTICS AT COLL.:                                RECEIVED :  08/23/20 22:27  Stain, Gram                                FINAL       08/24/20 00:32   +  08/24/20   Few WBCs             No Squamous epithelial cells seen             Many Gram positive cocci  Culture and Gram Stain, Aerobic, Wound      FINAL       08/27/20 15:53   +  08/24/20   Moderate growth of mixed cutaneous flora  08/27/20   Light growth of Escherichia coli      08/26/20   Moderate growth of Enterococcus faecalis      08/27/20   Very light growth of Klebsiella pneumoniae      _____________________________________________________________________________                                     E.coli        E.faecalis     K.pneumoniae    ANTIBIOTICS                     MIC  INTRP      MIC  INTRP      MIC  INTRP      _____________________________________________________________________________  Amoxicillin/CA                 <=4/2   S                       <=4/2   S        Ampicillin                      <=4    S         2     S        >16    R        Ampicillin/sulbactam            4/2    S  8/4    S        Aztreonam                       <=2    S                        <=2    S        Cefazolin                        2     S                         2     S        Cefepime                        <=1    S                        <=1    S        Cefoxitin                       <=4    S                        <=4    S        Ceftazidime                     <=2    S                        <=2    S        Ceftriaxone                     <=1    S                        <=1    S        Cefuroxime                      <=4    S                        <=4    S        Ciprofloxacin                 <=0.25   S                      <=0.25   S        Ertapenem                     <=0.25   S                      <=0.25   S        Gentamicin                      <=2    S                        <=  2    S        Gentamicin High Level Resistan                 <=500   S                        Levofloxacin                   <=0.5   S                       <=0.5   S        Meropenem                      <=0.5   S                       <=0.5   S        Penicillin                                       4     S                         Piperacillin/Tazobactam        <=2/4   S                        4/4    S        Streptomycin High Level Resist                <=1000   S                        Tetracycline                    <=2    S                        <=2    S        Trimethoprim/Sulfamethoxazole <=0.5/9  S                      <=0.5/9  S        Vancomycin                                       1     S                        _____________________________________________________________________________            S=SUSCEPTIBLE     I=INTERMEDIATE     R=RESISTANT                            N/S=NON-SUSCEPTIBLE  _____________________________________________________________________________      Additional Culture Request [[id number]] Collected: 08/23/20 1724    Order Status: Completed Specimen: Abrasion Updated: 08/25/20 0956    Narrative:      ORDER#: [id number]  ORDERED BY: GOWDA, MURLIYA  SOURCE: Abrasion R 2nd toe                           COLLECTED:  08/23/20 17:24  ANTIBIOTICS AT COLL.:                                RECEIVED :  08/23/20 22:27  Additional Culture Request                 FINAL       08/25/20 09:56  08/25/20   E. coli susceptibility requested by physician:             Dr. Fredia Beets on Order [phone]      MRSA culture [[id number]] Collected: 08/22/20 2133    Order Status: Completed Specimen: Culturette from Nasal Swab Updated: 08/23/20 2038     Culture MRSA Surveillance Negative for Methicillin Resistant Staph aureus    MRSA culture [[id number]] Collected: 08/22/20 2133    Order Status: Completed Specimen: Culturette from Throat Updated: 08/23/20 2038     Culture MRSA Surveillance Negative for Methicillin Resistant Staph aureus    Culture Blood Aerobic and Anaerobic [[id number]] Collected: 08/21/20 1809    Order Status: Completed Specimen: Arm from Blood, Venipuncture Updated: 08/27/20 0321    Narrative:      ORDER#: [id number]                                    ORDERED BY: Kelby Aline  SOURCE:  Blood, Venipuncture Arm                      COLLECTED:  08/21/20 18:09  ANTIBIOTICS AT COLL.:                                RECEIVED :  08/22/20 00:57  Culture Blood Aerobic and Anaerobic        FINAL       08/27/20 03:21  08/27/20   No growth after 5 days of incubation.      Culture Blood Aerobic and Anaerobic [[id number]] Collected: 08/21/20 1736    Order Status: Completed Specimen: Arm from Blood, Venipuncture Updated: 08/27/20 0321    Narrative:      ORDER#: [id number]                                    ORDERED BY: Kelby Aline  SOURCE: Blood, Venipuncture Arm                      COLLECTED:  08/21/20 17:36  ANTIBIOTICS AT COLL.:                                RECEIVED :  08/22/20 00:57  Culture Blood Aerobic and Anaerobic        FINAL       08/27/20 03:21  08/27/20   No growth after 5 days of incubation.               RADIOLOGY     Radiological Procedure personally reviewed and concur with radiologist reports unless  stated otherwise.    US Arterial / Graft Duplex Doppler Lower Extremity Right   Final Result       1. Right lower extremity: No arterial insufficiency of the right lower   extremity at rest with triphasic distal tibial waveforms and normal   waveforms of the right second toe.   2. Left lower extremity: No arterial insufficiency of the left lower   extremity at rest.      Adelina Mings, MD    08/24/2020 10:05 AM      [place] Noninvas Low Extrem Art Dopp/Press/Wavefrms (PVR) Comp 3-4 Levels   Final Result       1. Right lower extremity: No arterial insufficiency of the right lower   extremity at rest with triphasic distal tibial waveforms and normal   waveforms of the right second toe.   2. Left lower extremity: No arterial insufficiency of the left lower   extremity at rest.      Adelina Mings, MD    08/24/2020 10:05 AM      MRI Foot Right W WO Contrast   Final Result      1.  Osseous erosion and fragmentation of the second toe distal phalanx   with abnormal marrow edema in the second toe middle and distal    phalanges, compatible with osteomyelitis.      2.  Degenerative changes of the midfoot with adjacent osseous erosions   and extensive marrow edema/enhancement. These findings are likely due to   Charcot arthropathy, although superimposed infection is in region is   difficult to exclude.      3.  Diffuse subcutaneous edema and mild enhancement throughout the foot,   especially in the second toe, compatible with cellulitis. No discrete   abscess or drainable fluid collection is identified.      These urgent results were discussed with and acknowledged by Burnett Kanaris, DO, on 08/22/2020 at 2:50 PM.      Carla Drape, MD    08/22/2020 2:50 PM      Foot Right AP Lateral And Oblique   Final Result         1.  Abnormal lucency, erosion, and fragmentation of the second toe   distal phalanx, most likely representing osteomyelitis with superimposed   pathologic fracture. There is pronounced soft tissue swelling of the   second toe and dorsal foot.      2.  Abnormal lateral subluxation of the second TMT joint and, to a   slightly lesser extent, third and fourth TMT joints, concerning for   Lisfranc ligamentous injury. There are associated degenerative changes   and ossification along the dorsal midfoot, which may be reflective of   developing Charcot arthropathy.      3.  NOTIFICATION: The critical findings of this study were discussed   with, and acknowledged by, Dr. Kelby Aline via telephone at 6:53 PM on   08/21/2020.      Vassie Moment, MD    08/21/2020 6:54 PM          Signed,  Marry Guan, MD  3:33 PM 08/29/2020
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
## 2020-08-29 NOTE — Plan of Care (Signed)
Problem: Safety  Goal: Patient will be free from injury during hospitalization  Outcome: Progressing  Flowsheets (Taken 08/29/2020 0038)  Patient will be free from injury during hospitalization:   Assess patient's risk for falls and implement fall prevention plan of care per policy   Provide and maintain safe environment   Hourly rounding   Include patient/ family/ care giver in decisions related to safety     Problem: Pain  Goal: Pain at adequate level as identified by patient  Outcome: Progressing  Flowsheets (Taken 08/29/2020 2353)  Pain at adequate level as identified by patient:   Identify patient comfort function goal   Assess pain on admission, during daily assessment and/or before any "as needed" intervention(s)   Evaluate if patient comfort function goal is met   Evaluate patient's satisfaction with pain management progress   Offer non-pharmacological pain management interventions   Include patient/patient care companion in decisions related to pain management as needed     Problem: Psychosocial and Spiritual Needs  Goal: Demonstrates ability to cope with hospitalization/illness  Outcome: Progressing  Flowsheets (Taken 08/29/2020 0038)  Demonstrates ability to cope with hospitalizations/illness:   Encourage verbalization of feelings/concerns/expectations   Include patient/ patient care companion in decisions   Provide quiet environment     Problem: Diabetes: Glucose Imbalance  Goal: Blood glucose stable at established goal  Outcome: Not Progressing  Flowsheets (Taken 08/29/2020 0038)  Blood glucose stable at established goal:   Monitor lab values   Include patient/family in decisions related to nutrition/dietary selections   Monitor/assess vital signs   Assess for hypoglycemia /hyperglycemia   Ensure adequate hydration   Ensure patient/family has adequate teaching materials

## 2020-08-29 NOTE — Brief Op Note (Signed)
BRIEF OP NOTE    Date Time: 08/29/20 6:59 PM    Patient Name:   Daniel Hebert    Date of Operation:   08/29/2020    Providers Performing:   Surgeon(s):  Reita Chard, DPM    Assistant (s):   Circulator: Lucilla Edin, RN  Scrub Person: Mardene Celeste  Preceptor: Nicki Reaper, RN    Operative Procedure:   Procedure(s):  BONE BIOPSY OF SECOND TOE WITH C-ARM FLUOROSCOPY  INCISION AND DRAINAGE OF ULCER RIGHT FOOT    Preoperative Diagnosis:   Pre-Op Diagnosis Codes:     * Osteomyelitis of right foot, unspecified type [M86.9]     * Ulcer of right foot, unspecified ulcer stage [L97.519]    Postoperative Diagnosis:   Post-Op Diagnosis Codes:     * Osteomyelitis of right foot, unspecified type [M86.9]     * Ulcer of right foot, unspecified ulcer stage [L97.519]    Anesthesia:   Monitor Anesthesia Care with 10 mL of 1:1 mixture of 1% lidocaine and 0.5% marcaine plain injected to the right foot  Estimated Blood Loss:    * No values recorded between 08/29/2020  5:45 PM and 08/29/2020  6:31 PM *    Implants:   Vancomycin powder placed into the wound bed of right second toe    Drains:   Drains: packing 1/4inch iodoform     Specimens:     ID Type Source Tests Collected by Time Destination   A : Right second intermediate phalanx bone Biopsy Bone SURGICAL PATHOLOGY Reita Chard, DPM 08/29/2020 1809    B : Right second distal phalanx bone Biopsy Bone SURGICAL PATHOLOGY Reita Chard, DPM 08/29/2020 1816          Findings:   Soft distal phalanx, wound probed down to the middle phalanx. Intermediate phalanx was noted to be harder in quality of bone compared to distal phalanx of right second toe. No frank purulence.     Complications:   None      Signed by: Javi Bollman Ammie Dalton, DPM                                                                           [facility]

## 2020-08-29 NOTE — Progress Notes (Signed)
Nutritional Support Services  Nutrition Follow Up    Daniel Hebert 60 y.o. male   MRN: [id number]        Nutrition Recommendation:  Goal: Maintain PO intake >75% of meals. BG levels <180 mg/dL.      Plan:              1) Continue Consistent Carbohydrate diet                  2) Recommend insulin for optimal glycemic control <180 mg/dL (noted BG 540-981 mg/dL X91 hrs) though pt refusing insulin over the last 8 days      Donetta Potts, RDN, LDN  Clinical Dietitian  [phone]    ________________________________________________________  Assessment Data:  Summary: 60 y.o. male who presented with R Foot Pain. 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 Recent MRI showed no acute OM or septic arthritis to R foot. Foot/Ankle Ortho recommended pt to hospital for IV abx and Podiatry eval. In the ED, hemodynamically stable, started on Vanc+Zosyn and admitted for further management. LA neg. Cx pending. Podiatry following. Recommending at minimum a Partial amputation of 2nd toe R foot as surgical cure of OM. Pt is determined not to undergo any amputations. Pt believes that a couple days of IV abx and possibility of receiving Hyperbaric oxygen and his concoction of herbs will cure infection R foot. Pt informed that a delay in surgery may require a Total 2nd toe amputation. Pt is willing to take his chances. MRI R foot + for Osteomyelitis on 7/21. Podiatry recommending pt be d/c'd w/ PO abx since he continues to refuse amputation. Pt refusing long-term IV avx on 7/22. Started amlodipine for elevated BP on 7/22. Psych consulted and deemed pt capable of making his own decisions. Pt NPO today (7/27) for possible bone bx.        Pt seen, awake and alert. Very agitated and upset with hospital staff. C/o staff taking his razor. Stated that appetite remains stable. Reports average intake ~100% so far this admission. Stated that the food is the  best that he has ever had at a hospital. Of meals documented, average intake of 100% x2 meals recorded. Intake detailed below. C/o nausea but no vomiting. Having regular BM's and reports receiving bowel regimen. No significant peripheral edema. Noted BG 277-331 mg/dL F62 hrs. Pt refusing insulin over the last 8 days. D/w pt that BG has been high. Pt on a rant about how bad insulin is and not receptive to any information. C/o inability to obtain herbs from his car that help lower BG. Denies nutrition-related questions/concerns at this time. Will continue to follow per protocol.        Assessment Data:  Adm dx:  Cellulitis of left lower extremity   Patient Active Problem List   Diagnosis    Cellulitis of left lower extremity     Recent Labs   Lab 08/28/20  1003 08/28/20  0547 08/27/20  0524 08/26/20  0709 08/25/20  0800 08/24/20  0635 08/23/20  0618   Sodium  --  133* 134* 133* 135* 136 135*   Potassium 4.7 5.7* 5.0 4.9 4.9 4.7 4.3   Chloride  --  101 102 104 104 104 105   CO2  --  24 23 20* 25 25 22    BUN  --  13 15 12 14 12 11    Creatinine  --  1.2 1.1 0.9 1.1 1.0 0.9   Glucose  --  277* 282* 270* 291* 214* 274*   Calcium  --  9.9 9.4 9.8 9.9 9.3 9.0   Magnesium  --  2.0 1.8 1.9 2.0 1.9 1.9   EGFR  --  >60.0 >60.0 >60.0 >60.0 >60.0 >60.0   WBC  --  6.42 6.28 6.22 5.58 4.91 4.15   Hematocrit  --  43.6 43.1 44.0 45.1 40.2 40.0   Hgb  --  14.9 14.8 15.5 14.9 14.0 13.3   Bilirubin, Total  --   --   --   --   --  0.8 0.7   Bilirubin Direct  --   --   --   --   --  0.2  --    Bilirubin Indirect  --   --   --   --   --  0.6  --    AST (SGOT)  --   --   --   --   --  46* 18   ALT  --   --   --   --   --  53 30   Alkaline Phosphatase  --   --   --   --   --  82 81       Recent Labs   Lab 08/29/20  0756 08/28/20  2112 08/28/20  1709 08/28/20  1147 08/28/20  0753   Whole Blood Glucose POCT 331* 382* 370* 323* 306*     Pertinent meds: Amlodipine, Colace, Advil, Zosyn, Miralax   PRN Meds given in the past 48 hours: N/A        Orders Placed This Encounter      Diet NPO effective now - Surgery/Procedure    Orders Placed This Encounter   Procedures    Diet NPO effective now - Surgery/Procedure     Food intake: Stated that appetite remains stable. Reports average intake ~100% so far this admission. Stated that the food is the best that he has ever had at a hospital. Of meals documented, average intake of 100% x2 meals recorded.       Limited intake of meals/ONS documented per I/O flowsheet: 7/21: 100%; 7/25: 100%.      GI symptoms: No complaints.     Learning Needs: Pt not receptive to information.  D/w pt that BG has been high. Pt on a rant about how bad insulin. C/o inability to obtain herbs from his car that help lower BG. Denies nutrition-related questions/concerns at this time.       Nutrition Diagnosis:     Altered Nutrition Related lab values related to uncontrolled diabetes as evidenced by POC 217-342 mg/dL Z61 hr and current WRUE4V of 8.7. (Status: Not progressing).    Intervention:   Goal: Maintain PO intake >75% of meals. BG levels <180 mg/dL.      Plan:              1) Continue Consistent Carbohydrate diet                  2) Recommend insulin for optimal glycemic control <180 mg/dL (noted BG 409-811 mg/dL B14 hrs) though pt refusing insulin over the last 8 days    Monitoring/Evaluation:   PO intake  Weights  GI symptoms      Donetta Potts, RDN, LDN  Clinical Dietitian  [phone]

## 2020-08-29 NOTE — Transfer of Care (Signed)
Anesthesia Transfer of Care Note    Patient: Daniel Hebert    Procedures performed: Procedure(s):  BONE BIOPSY OF SECOND TOE WITH C-ARM FLUOROSCOPY  INCISION AND DRAINAGE OF ULCER RIGHT FOOT    Pt breathing spontaneously, drowsy, VSS. Report given to RN.    Signed by: Ernst Bowler, CRNA  08/29/20 6:38 PM

## 2020-08-30 ENCOUNTER — Other Ambulatory Visit: Payer: Self-pay

## 2020-08-30 ENCOUNTER — Encounter: Payer: Self-pay | Admitting: Foot and Ankle Surgery

## 2020-08-30 DIAGNOSIS — E1142 Type 2 diabetes mellitus with diabetic polyneuropathy: Secondary | ICD-10-CM

## 2020-08-30 DIAGNOSIS — L97513 Non-pressure chronic ulcer of other part of right foot with necrosis of muscle: Secondary | ICD-10-CM

## 2020-08-30 DIAGNOSIS — M86171 Other acute osteomyelitis, right ankle and foot: Secondary | ICD-10-CM

## 2020-08-30 LAB — BASIC METABOLIC PANEL
Anion Gap: 10 (ref 5.0–15.0)
BUN: 14 mg/dL (ref 9–28)
CO2: 24 mEq/L (ref 22–29)
Calcium: 10.1 mg/dL (ref 8.5–10.5)
Chloride: 100 mEq/L (ref 100–111)
Creatinine: 1.2 mg/dL (ref 0.7–1.3)
Glucose: 268 mg/dL — ABNORMAL HIGH (ref 70–100)
Potassium: 4.7 mEq/L (ref 3.5–5.1)
Sodium: 134 mEq/L — ABNORMAL LOW (ref 136–145)

## 2020-08-30 LAB — GLUCOSE WHOLE BLOOD - POCT
Whole Blood Glucose POCT: 276 mg/dL — ABNORMAL HIGH (ref 70–100)
Whole Blood Glucose POCT: 294 mg/dL — ABNORMAL HIGH (ref 70–100)
Whole Blood Glucose POCT: 407 mg/dL — ABNORMAL HIGH (ref 70–100)
Whole Blood Glucose POCT: 383 mg/dL — ABNORMAL HIGH (ref 70–100)

## 2020-08-30 LAB — GFR: EGFR: >60

## 2020-08-30 MED ORDER — CARBOXYMETHYLCELLULOSE SODIUM 0.5 % OP SOLN
1.0000 [drp] | Freq: Three times a day (TID) | OPHTHALMIC | Status: DC | PRN
Start: 2020-08-30 — End: 2020-08-30
  Administered 2020-08-30 (×2): 1 [drp] via OPHTHALMIC
  Filled 2020-08-30 (×2): qty 1

## 2020-08-30 MED ORDER — CARBOXYMETHYLCELLULOSE SODIUM 0.5 % OP SOLN
1.0000 [drp] | Freq: Four times a day (QID) | OPHTHALMIC | Status: DC
Start: 2020-08-30 — End: 2020-09-03
  Administered 2020-08-30 – 2020-09-03 (×17): 1 [drp] via OPHTHALMIC
  Filled 2020-08-30 (×21): qty 1

## 2020-08-30 NOTE — Progress Notes (Signed)
ID PROGRESS NOTE    Date Time: 08/30/20 11:42 AM  Patient Name: Daniel Hebert        Medications/ ABX:     Zosyn #9      S/p vanco     Lines:     CENTRAL LINES: NONE  left arm P-IV no erythema nontender      Assessment:      60 yo male hx of  brain tumor (unclear if malignant/benign), GERD, Hx of MRSA right hallux infection (2017), MRSA infection of the right shoulder s/p right shoulder surgery (2017), uncontrolled DM-2, Hx of "black mold infection".       Extremely poor historian, tangential thought process  Poor insight into medical issues  Room search earlier in admission: Pocket knife & blade/shaving razor  Noncompliant with traditional medicines    Admitted with chronic right forefoot swelling (at least since 01/2020)  Worsening of right second toe swelling, wound ~03/2020  Admitted with 1 week Hx of progressive swelling, redness, drainage, nail has fallen off    On admission afebrile, markedly elevated BP (180s/120s)  Normal WBC  ESR 38  Normal lactate  Normal WBC  X-ray & osteomyelitis C/W right second toe osteomyelitis & Charcot foot & pathologic fracture right second toe  MRI: Osteomyelitis right second toe & Charcot foot  arterial Dopplers no vascular disease  7/27 I&D & bone biopsy    Plan:     1.  Right second toe osteomyelitis with pathologic fracture/Charcot foot.  -Has expressed traditional therapies such as BP control, insulin is useless in helping his diabetes.  -S/p or yesterday I&D and bone biopsy.  -He would like to await the pathology report before making a decision on further surgical intervention.  -He does not want to amputate his foot.  -States in the past he has had a PICC line before and does not want that.  -Regardless he is a poor candidate for PICC line given poor hygiene, uncertain living conditions, poor medical insight  -It is not clear where he lives patient travels from [place] to [facility], has mentioned to staff that he does not update his address (mentioned govt conspiracy).  -On prior  visits and on today's visit,I was firm in telling him that he needs an amputation for a cure  -Superficial wound CX with E.  Faecalis, Klebsiella & E. coli.  -Intra-Op CX pending  -MRSA screen negative  -Continue Zosyn  -Possibly back to the OR 7/29/pending path report  -Should he decide to leave AMA can send with Rx for  Augmentin 875/125Q 12 HR      2.  PSYCH.  -Patient with tangential thought process, poor insight into medical health, pressured speech & difficult to redirect/reorient to pertinent conversation  -Several times during my conversation I had to ask him if I may speak to explain the plan  -Please see psych note  -Most of today's visit was again patient sharing  grievances about prior providers (prior to this admission & other providers during this admission    My visit today was in the presence of the bedside nurse      will follow  peripherally       Signed by: Golden Circle, MD,   Pager [phone]  Epic chat / group epic chat [facility] FOH/[id number]  Office [phone]    Subjective:     At times refusing medications  Afebrile  BP elevated  S/p or yesterday: I&D, biopsy right second toe.    Endorses mild  discomfort  Tolerating ABX  Denied F/C, N/V, diarrhea, rashes    Patient wants to go to his car to get his herbal medications    Denied chest pain, abdominal pain, or other arthralgias or myalgias    Review of Systems:       As above    Physical Exam:     Vitals:    08/30/20 0753   BP: 143/79   Pulse: 93   Resp: 16   Temp: 98.1 F (36.7 C)   SpO2: 95% RA       Tmax 98.1   GEN: NAD, sitting upright in bed, talking continuously  HEENT: AT/NC, anicteric sclera  CV: RRR, Limited exam as patient talking  PULM: CTA bilaterally, no Rales again limited as patient talking throughout exam  ABD: Soft, NT, hypoactive BS, no G/R, mild distention  EXT: No BLE edema   Right leg wrapped in clean dressing with ice pack on top.  DERM: No rashes  NEURO: No facial droop  PSYCH: Pressured speech, tangential conversation.  Difficult  to redirect,  Frequently interrupts when  attempting to explain plan    Labs:         Recent Labs   Lab 08/28/20  0547 08/27/20  0524 08/26/20  0709   WBC 6.42 6.28 6.22   RBC 5.04 5.01 5.14   Hgb 14.9 14.8 15.5   Hematocrit 43.6 43.1 44.0   MCV 86.5 86.0 85.6   MCHC 34.2 34.3 35.2   RDW 13 12 12    MPV 8.3* 8.3* 8.3*   Platelets 333 300 312         Recent Labs   Lab 08/30/20  0614 08/28/20  1003 08/28/20  0547 08/27/20  0524 08/26/20  0709 08/25/20  0800   Sodium 134*  --  133* 134* 133* 135*   Potassium 4.7 4.7 5.7* 5.0 4.9 4.9   Chloride 100  --  101 102 104 104   CO2 24  --  24 23 20* 25   BUN 14  --  13 15 12 14    Creatinine 1.2  --  1.2 1.1 0.9 1.1   Glucose 268*  --  277* 282* 270* 291*   Calcium 10.1  --  9.9 9.4 9.8 9.9   Magnesium  --   --  2.0 1.8 1.9 2.0         Recent Labs   Lab 08/24/20  0635   ALT 53   AST (SGOT) 46*   Bilirubin, Total 0.8   Bilirubin Direct 0.2   Albumin 3.1*   Alkaline Phosphatase 82         MICRO  7/27 anaerobic, wound CX pending  7/27 anaerobic, wound CX pending  7/27 anaerobic, wound CX pending    7/21 wound CX E. coli, E faecalis, Klebsiella  7/20 MRSA nares, throat negative  7/20 blood CX NGTD    Rads:     7/21 arterial Dopplers1. Right lower extremity: No arterial insufficiency of the right lower  extremity at rest with triphasic distal tibial waveforms and normal  waveforms of the right second toe.  2. Left lower extremity: No arterial insufficiency of the left lower  extremity at rest.    7/20 MRI of right foot1  1.  Osseous erosion and fragmentation of the second toe distal phalanx  with abnormal marrow edema in the second toe middle and distal  phalanges, compatible with osteomyelitis.   2.  Degenerative changes of the midfoot with adjacent osseous  erosions  and extensive marrow edema/enhancement. 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   3.  Diffuse subcutaneous edema and mild enhancement  throughout the foot,  especially in the second toe, compatible with cellulitis. No discrete  abscess or drainable fluid collection is identified.    7/19 x-ray right foot  .  Abnormal lucency, erosion, and fragmentation of the second toe  distal phalanx, most likely representing osteomyelitis with superimposed  pathologic fracture. There is pronounced soft tissue swelling of the  second toe and dorsal foot.     2.  Abnormal lateral subluxation of the second TMT joint and, to a  slightly lesser extent, third and fourth TMT joints, concerning for  Lisfranc ligamentous injury. There are associated degenerative changes  and ossification along the dorsal midfoot, which may be reflective of  developing Charcot arthropathy.        ---------------------------------------------------------------------------------------  Signed by: Golden Circle, [place], [id number]

## 2020-08-30 NOTE — Progress Notes (Signed)
Patient interviewed and denies any anesthesia complications such as nausea, vomiting or sore throat.  Patient reporting itchy eyes bilaterally. Refresh  plus tears ordered every 6 hours. Voiding without difficulty.

## 2020-08-30 NOTE — Progress Notes (Signed)
WOC nurse f/u deferred. Pt had OR procedure for bone bx, I/D yesterday. Dressing not to be changed until 7/29. Pending culture results, possibility of OR Friday per podiatrist

## 2020-08-30 NOTE — Progress Notes (Signed)
Day Before Surgery Confirmation Call    Spoke to: patient.  Patient states he is already admitted in the hospital and is aware of pending procedure.  States he is waiting for biopsy results to confirm if surgery will proceed tomorrow.      Confirmed surgery date, arrival time, and location.   Surgery: 1600    NPO instructions reviewed: Clear liquids up to 2 hours prior to arrival time, then NPO. No solid food 8 hours prior to scheduled procedure time. Examples of clear liquids include water, apple juice, sports drinks such as Gatorade, coffee or tea without milk or cream. Sugar or sweetener may be added    Fasting Requirements per Preoperative Fasting Guidelines for Elective Surgeries and Procedures Requiring Anesthesia Policy Revised 06/2019  Ingested material Fasting requirement   Clear liquids/Ice Chips 2 hours prior to arrival time   Breast milk 4 hours prior to scheduled procedure time   Infant formula 6 hours prior to scheduled procedure time   Non-human milk 8 hours prior to scheduled procedure time   Solid food 8 hours prior to scheduled procedure time       Ambulatory Screening Tool:   Patient instructed that if you or your family does develop new symptoms of acute respiratory illness, to contact surgeons office immediately, prior to arrival at the hospital.     Patient denies recent visit to ED, hospitalization or PCP visit for acute illness since [facility] RN interview.     Visitor Restriction Guidelines per [facility] as of 07/04/20:  All visitors must adhere to the following:   Exhibit no COVID-19 symptoms   Age 9+   In keeping with the CDC's current guidance, regardless of vaccination status, everyone in a healthcare facility must wear a mask covering their mouth and nose the entire time they are in the facility. Visitors who fail to wear a mask properly will be asked to leave. The following face coverings cannot be worn at any [place] location: gaiter style masks, bandanas or vented masks.    Visitation for COVID-19 (+) and PUI patients is now permitted and subject to the same visitation guidance as noted below.   Hospital Inpatient:   Visitation hours: 8:00 a.m. - 11:00 p.m. daily   Adult patients may have two (age 25+) per day. Minors must be accompanied by an adult at all times.   Pediatric patients may have two parents/guardians at bedside 24/7.   Overnight Visitation: Allowed for all patients in a private room- One person for adult patients.   Outpatient/Ambulatory Surgery:   Adult patients may have only one designated visitor (age 81+) to accompany the patient  For pediatric outpatients, only parents/guardians at bedside.   No family members/visitors will be allowed into Phase 1 recovery areas. Physicians will call contact person to give report of procedure.   Family / visitor will be called by PACU staff  to review discharge instructions via phone and answer any questions.     Advise to call surgeon if need to cancel surgery arises.     Patient verbalized understanding and acceptance of above information.  If a voicemail is left for the patient and any of the COVID19 ambulatory screening questions are positive, patient is asked to call [facility]SS ASAP.

## 2020-08-30 NOTE — Plan of Care (Signed)
Problem: Safety  Goal: Patient will be free from injury during hospitalization  Outcome: Progressing  Flowsheets (Taken 08/30/2020 1303)  Patient will be free from injury during hospitalization: Provide and maintain safe environment   Pt able to call for assistance. Alert and oriented to call/ask for help.    Problem: Psychosocial and Spiritual Needs  Goal: Demonstrates ability to cope with hospitalization/illness  Outcome: Progressing  Flowsheets (Taken 08/30/2020 1303)  Demonstrates ability to cope with hospitalizations/illness:   Encourage verbalization of feelings/concerns/expectations   Provide quiet environment   Pt able to vocalize needs/wants and declines interventions he does not feel are essential to him.

## 2020-08-30 NOTE — Progress Notes (Addendum)
[facility]  HOSPITALIST  PROGRESS NOTE      Patient: Daniel Hebert  Date: 08/30/2020   LOS: 9 Days  Admission Date: 08/21/2020   MRN: [id number]  Attending: Marry Guan, MD       ASSESSMENT/PLAN     Daniel Hebert is a 60 y.o. male admitted with below       Osteomyelitis and Diabetic ulcer right second toe, cellulitis right foot, Charcot osteoarthropathy midfoot RT:  - Post bone biopsy.  Follow cultures, pathology report erythema and cellulitis improved  - On OR schedule for tomorrow in case biopsy positive for osteomyelitis and patient agreeable for surgical amputation  -- Continue current antibiotics with Zosyn.  Wound cultures with light growth of E. coli, E faecalis and Klebsiella  - Arterial duplex negative  Is a poor candidate for home IV antibiotics and as such if not decided on surgical option, will likely discharge on prolonged course of oral antibiotics.  Patient was informed that surgical cure likely will be achieved with amputation however he is hesitant on getting bone biopsy, intraoperative cultures report back before making that decision.    Uncontrolled DM: Blood sugar elevated.  Refuses to take medications including oral or insulin.. Reports he takes herbs. A1C 8.7.  Will change Accu-Cheks to twice daily  Uncontrolled HTN: Profile generally improved.  Continue amlodipine  Possible medical non compliance, Concern for psychiatric disorder: Seen by psychiatry, deemed capable of making his own decisions.  Multiple grievances to staff and treatment team.  Continue supportive care as possible.  Follow-up psychiatry as needed .    History of meningioma: Outpatient follow-up  Stable hyponatremia likely due to hyperglycemia  7.    Medical non compliance    Dispo: Likely home pending further decision on definitive treatment of osteomyelitis with surgical amputation, follow-up pathology report      Patient Lines/Drains/Airways Status       Active PICC Line / CVC Line / PIV Line / Drain / Airway /  Intraosseous Line / Epidural Line / ART Line / Line / Wound / Pressure Ulcer / NG/OG Tube       Name Placement date Placement time Site Days    Peripheral IV 08/21/20 20 G Standard Left Antecubital 08/21/20  1730  Antecubital  1                             Care Plan discussed with nursing, and case manager       Patient seen and examined today at bedside . no acute complaints    MEDICATIONS     Current Facility-Administered Medications   Medication Dose Route Frequency    amLODIPine  10 mg Oral Daily    cadexomer iodine   Topical Daily    carboxymethylcellulose (PF)  1 drop Both Eyes QID    docusate sodium  100 mg Oral Daily    heparin (porcine)  5,000 Units Subcutaneous Q12H SCH    insulin glargine  20 Units Subcutaneous QHS    insulin lispro  1-3 Units Subcutaneous QHS    insulin lispro  1-5 Units Subcutaneous TID AC    insulin lispro  3 Units Subcutaneous Once    piperacillin-tazobactam  4.5 g Intravenous Q8H    polyethylene glycol  17 g Oral Daily       ROS       Neurological: No headache or dizziness  Respiratory: No cough or shortness of breath  Cardiovascular: No  chest pain      PHYSICAL EXAM     Vitals:    08/30/20 0753   BP: 143/79   Pulse: 93   Resp: 16   Temp: 98.1 F (36.7 C)   SpO2: 95%       Temperature: Temp  Min: 97 F (36.1 C)  Max: 98.1 F (36.7 C)  Pulse: Pulse  Min: 71  Max: 93  Respiratory: Resp  Min: 14  Max: 18  Non-Invasive BP: BP  Min: 113/67  Max: 172/95  Pulse Oximetry SpO2  Min: 95 %  Max: 100 %    Intake and Output Summary (Last 24 hours) at Date Time    Intake/Output Summary (Last 24 hours) at 08/30/2020 1127  Last data filed at 08/30/2020 0820  Gross per 24 hour   Intake 210 ml   Output --   Net 210 ml         GEN APPEARANCE: Alert and oriented   CVS: RRR,  LUNGS: CTAB; No Wheezes; No Rhonchi: No rales  Vascular: Dorsalis pedis pulses are palpable  ABD: Soft; No TTP  EXT: Right foot dressing in place.   NEURO: Nonfocal.  Psych: Patient is cooperative, positive some paranoid and  grandiose thoughts but no hallucination or delusions    LABS     Recent Labs   Lab 08/28/20  0547 08/27/20  0524 08/26/20  0709   WBC 6.42 6.28 6.22   RBC 5.04 5.01 5.14   Hgb 14.9 14.8 15.5   Hematocrit 43.6 43.1 44.0   MCV 86.5 86.0 85.6   Platelets 333 300 312       Recent Labs   Lab 08/30/20  0614 08/28/20  1003 08/28/20  0547 08/27/20  0524 08/26/20  0709 08/25/20  0800 08/24/20  0635   Sodium 134*  --  133* 134* 133* 135* 136   Potassium 4.7 4.7 5.7* 5.0 4.9 4.9 4.7   Chloride 100  --  101 102 104 104 104   CO2 24  --  24 23 20* 25 25   BUN 14  --  13 15 12 14 12    Creatinine 1.2  --  1.2 1.1 0.9 1.1 1.0   Glucose 268*  --  277* 282* 270* 291* 214*   Calcium 10.1  --  9.9 9.4 9.8 9.9 9.3   Magnesium  --   --  2.0 1.8 1.9 2.0 1.9       Recent Labs   Lab 08/24/20  0635   ALT 53   AST (SGOT) 46*   Bilirubin, Total 0.8   Bilirubin Direct 0.2   Albumin 3.1*   Alkaline Phosphatase 82                     Microbiology Results (last 15 days)       Procedure Component Value Units Date/Time    Culture + Gram Azzie Glatter Wound [[id number]] Collected: 08/29/20 1816    Order Status: Completed Specimen: Wound from Bone Updated: 08/30/20 0518    Narrative:      ORDER#: [id number]                                    ORDERED BY: Allena Katz, SHRUTI  SOURCE: Bone Right second intermediate phala         COLLECTED:  08/29/20 18:16  ANTIBIOTICS AT COLL.:  RECEIVED :  08/30/20 01:11  Stain, Gram                                FINAL       08/30/20 05:18  08/30/20   No WBCs or organisms seen             No Squamous epithelial cells seen  Culture and Gram Stain, Aerobic, Wound     PENDING      Culture, Anaerobic Bacteria [[id number]] Collected: 08/29/20 1816    Order Status: Sent Specimen: Other from Bone Updated: 08/30/20 0112    Culture + Gram Stain,Aerobic, Wound [[id number]] Collected: 08/29/20 1810    Order Status: Completed Specimen: Wound from Bone Updated: 08/30/20 0517    Narrative:      ORDER#: [id number]                                     ORDERED BY: PATEL, SHRUTI  SOURCE: Bone Right second distal phalanx bon         COLLECTED:  08/29/20 18:10  ANTIBIOTICS AT COLL.:                                RECEIVED :  08/30/20 01:11  Stain, Gram                                FINAL       08/30/20 05:17  08/30/20   No WBCs or organisms seen             No Squamous epithelial cells seen  Culture and Gram Stain, Aerobic, Wound     PENDING      Culture, Anaerobic Bacteria [[id number]] Collected: 08/29/20 1810    Order Status: Sent Specimen: Other from Bone Updated: 08/30/20 0112    Culture, Anaerobic Bacteria [[id number]] Collected: 08/29/20 1805    Order Status: Sent Specimen: Other from Ulcer Updated: 08/30/20 0057    Culture + Gram Stain,Aerobic, Wound [[id number]] Collected: 08/29/20 1805    Order Status: Completed Specimen: Wound from Ulcer Updated: 08/30/20 0339    Narrative:      ORDER#: [id number]                                    ORDERED BY: Allena Katz, SHRUTI  SOURCE: Ulcer Right second toe culture               COLLECTED:  08/29/20 18:05  ANTIBIOTICS AT COLL.:                                RECEIVED :  08/30/20 00:57  Stain, Gram                                FINAL       08/30/20 03:26  08/30/20   Few WBCs             No organisms seen             No Squamous epithelial cells seen  Culture and  Gram Stain, Aerobic, Wound     PENDING      Microbiology additional request [[id number]] Collected: 08/25/20 0952    Order Status: Completed Specimen: Blood from Wound Updated: 08/25/20 0957    Narrative:      [id number] - Ecoli sensitivities, thanks.  ORDER#: [id number]                                    ORDERED BY: Rayetta Humphrey, TREVOR  SOURCE: Wound wound                                  COLLECTED:  08/25/20 09:52  ANTIBIOTICS AT COLL.:                                RECEIVED :  08/25/20 09:52  Microbiology Add On Request                FINAL       08/25/20 09:57  08/25/20   For results, see #[id number]      Culture + Gram Azzie Glatter  Wound [[id number]] Collected: 08/23/20 1724    Order Status: Completed Specimen: Wound from Abrasion Updated: 08/27/20 1553    Narrative:      ORDER#: [id number]                                    ORDERED BY: Elise Benne  SOURCE: Abrasion R 2nd toe                           COLLECTED:  08/23/20 17:24  ANTIBIOTICS AT COLL.:                                RECEIVED :  08/23/20 22:27  Stain, Gram                                FINAL       08/24/20 00:32   +  08/24/20   Few WBCs             No Squamous epithelial cells seen             Many Gram positive cocci  Culture and Gram Stain, Aerobic, Wound     FINAL       08/27/20 15:53   +  08/24/20   Moderate growth of mixed cutaneous flora  08/27/20   Light growth of Escherichia coli      08/26/20   Moderate growth of Enterococcus faecalis      08/27/20   Very light growth of Klebsiella pneumoniae      _____________________________________________________________________________                                     E.coli        E.faecalis     K.pneumoniae    ANTIBIOTICS                     MIC  INTRP  MIC  INTRP      MIC  INTRP      _____________________________________________________________________________  Amoxicillin/CA                 <=4/2   S                       <=4/2   S        Ampicillin                      <=4    S         2     S        >16    R        Ampicillin/sulbactam            4/2    S                        8/4    S        Aztreonam                       <=2    S                        <=2    S        Cefazolin                        2     S                         2     S        Cefepime                        <=1    S                        <=1    S        Cefoxitin                       <=4    S                        <=4    S        Ceftazidime                     <=2    S                        <=2    S        Ceftriaxone                     <=1    S                        <=1    S        Cefuroxime                      <=4    S                         <=  4    S        Ciprofloxacin                 <=0.25   S                      <=0.25   S        Ertapenem                     <=0.25   S                      <=0.25   S        Gentamicin                      <=2    S                        <=2    S        Gentamicin High Level [place]                 <=500   S                        Levofloxacin                   <=0.5   S                       <=0.5   S        Meropenem                      <=0.5   S                       <=0.5   S        Penicillin                                       4     S                        Piperacillin/Tazobactam        <=2/4   S                        4/4    S        Streptomycin High Level Resist                <=1000   S                        Tetracycline                    <=2    S                        <=2    S        Trimethoprim/Sulfamethoxazole <=0.5/9  S                      <=0.5/9  S  Vancomycin                                       1     S                        _____________________________________________________________________________            S=SUSCEPTIBLE     I=INTERMEDIATE     R=RESISTANT                            N/S=NON-SUSCEPTIBLE  _____________________________________________________________________________      Additional Culture Request [[id number]] Collected: 08/23/20 1724    Order Status: Completed Specimen: Abrasion Updated: 08/25/20 0956    Narrative:      ORDER#: [id number]                                    ORDERED BY: Elise Benne  SOURCE: Abrasion R 2nd toe                           COLLECTED:  08/23/20 17:24  ANTIBIOTICS AT COLL.:                                RECEIVED :  08/23/20 22:27  Additional Culture Request                 FINAL       08/25/20 09:56  08/25/20   E. coli susceptibility requested by physician:             Dr. Fredia Beets on Order [phone]      MRSA culture [[id number]] Collected: 08/22/20 2133    Order Status: Completed Specimen: Culturette from Nasal Swab  Updated: 08/23/20 2038     Culture MRSA Surveillance Negative for Methicillin Resistant Staph aureus    MRSA culture [[id number]] Collected: 08/22/20 2133    Order Status: Completed Specimen: Culturette from Throat Updated: 08/23/20 2038     Culture MRSA Surveillance Negative for Methicillin Resistant Staph aureus    Culture Blood Aerobic and Anaerobic [[id number]] Collected: 08/21/20 1809    Order Status: Completed Specimen: Arm from Blood, Venipuncture Updated: 08/27/20 0321    Narrative:      ORDER#: [id number]                                    ORDERED BY: Kelby Aline  SOURCE: Blood, Venipuncture Arm                      COLLECTED:  08/21/20 18:09  ANTIBIOTICS AT COLL.:                                RECEIVED :  08/22/20 00:57  Culture Blood Aerobic and Anaerobic        FINAL       08/27/20 03:21  08/27/20   No growth after 5 days of incubation.      Culture Blood Aerobic and Anaerobic [[id number]] Collected: 08/21/20 1736    Order Status: Completed  Specimen: Arm from Blood, Venipuncture Updated: 08/27/20 0321    Narrative:      ORDER#: [id number]                                    ORDERED BY: Kelby Aline  SOURCE: Blood, Venipuncture Arm                      COLLECTED:  08/21/20 17:36  ANTIBIOTICS AT COLL.:                                RECEIVED :  08/22/20 00:57  Culture Blood Aerobic and Anaerobic        FINAL       08/27/20 03:21  08/27/20   No growth after 5 days of incubation.               RADIOLOGY     Radiological Procedure personally reviewed and concur with radiologist reports unless stated otherwise.    US Arterial / Graft Duplex Doppler Lower Extremity Right   Final Result       1. Right lower extremity: No arterial insufficiency of the right lower   extremity at rest with triphasic distal tibial waveforms and normal   waveforms of the right second toe.   2. Left lower extremity: No arterial insufficiency of the left lower   extremity at rest.      Adelina Mings, MD    08/24/2020 10:05 AM      [place]  Noninvas Low Extrem Art Dopp/Press/Wavefrms (PVR) Comp 3-4 Levels   Final Result       1. Right lower extremity: No arterial insufficiency of the right lower   extremity at rest with triphasic distal tibial waveforms and normal   waveforms of the right second toe.   2. Left lower extremity: No arterial insufficiency of the left lower   extremity at rest.      Adelina Mings, MD    08/24/2020 10:05 AM      MRI Foot Right W WO Contrast   Final Result      1.  Osseous erosion and fragmentation of the second toe distal phalanx   with abnormal marrow edema in the second toe middle and distal   phalanges, compatible with osteomyelitis.      2.  Degenerative changes of the midfoot with adjacent osseous erosions   and extensive marrow edema/enhancement. These findings are likely due to   Charcot arthropathy, although superimposed infection is in region is   difficult to exclude.      3.  Diffuse subcutaneous edema and mild enhancement throughout the foot,   especially in the second toe, compatible with cellulitis. No discrete   abscess or drainable fluid collection is identified.      These urgent results were discussed with and acknowledged by Burnett Kanaris, DO, on 08/22/2020 at 2:50 PM.      Carla Drape, MD    08/22/2020 2:50 PM      Foot Right AP Lateral And Oblique   Final Result         1.  Abnormal lucency, erosion, and fragmentation of the second toe   distal phalanx, most likely representing osteomyelitis with superimposed   pathologic fracture. There is pronounced soft tissue swelling of the   second toe and dorsal  foot.      2.  Abnormal lateral subluxation of the second TMT joint and, to a   slightly lesser extent, third and fourth TMT joints, concerning for   Lisfranc ligamentous injury. There are associated degenerative changes   and ossification along the dorsal midfoot, which may be reflective of   developing Charcot arthropathy.      3.  NOTIFICATION: The critical findings of this study were discussed   with,  and acknowledged by, Dr. Kelby Aline via telephone at 6:53 PM on   08/21/2020.      Vassie Moment, MD    08/21/2020 6:54 PM          Signed,  Marry Guan, MD  11:27 AM 08/30/2020

## 2020-08-31 ENCOUNTER — Inpatient Hospital Stay: Payer: BC Managed Care – PPO | Admitting: Anesthesiology

## 2020-08-31 ENCOUNTER — Encounter
Admission: EM | Disposition: A | Discharge status: Home / Self Care | Payer: Self-pay | Source: Home / Self Care | Attending: Hospitalist

## 2020-08-31 ENCOUNTER — Inpatient Hospital Stay: Payer: BC Managed Care – PPO

## 2020-08-31 DIAGNOSIS — L03116 Cellulitis of left lower limb: Secondary | ICD-10-CM

## 2020-08-31 HISTORY — PX: AMPUTATION, TOE: SHX3041

## 2020-08-31 LAB — GLUCOSE WHOLE BLOOD - POCT
Whole Blood Glucose POCT: 261 mg/dL — ABNORMAL HIGH (ref 70–100)
Whole Blood Glucose POCT: 298 mg/dL — ABNORMAL HIGH (ref 70–100)
Whole Blood Glucose POCT: 388 mg/dL — ABNORMAL HIGH (ref 70–100)
Whole Blood Glucose POCT: 288 mg/dL — ABNORMAL HIGH (ref 70–100)
Whole Blood Glucose POCT: 254 mg/dL — ABNORMAL HIGH (ref 70–100)
Whole Blood Glucose POCT: 372 mg/dL — ABNORMAL HIGH (ref 70–100)

## 2020-08-31 SURGERY — AMPUTATION, TOE
Anesthesia: Anesthesia General | Site: Foot | Laterality: Right | Wound class: Clean

## 2020-08-31 MED ORDER — PROPOFOL INFUSION 10 MG/ML
INTRAVENOUS | Status: DC | PRN
Start: 2020-08-31 — End: 2020-08-31
  Administered 2020-08-31: 120 ug/kg/min via INTRAVENOUS

## 2020-08-31 MED ORDER — HYDROMORPHONE HCL 0.5 MG/0.5 ML IJ SOLN
0.5000 mg | INTRAMUSCULAR | Status: DC | PRN
Start: 2020-08-31 — End: 2020-08-31

## 2020-08-31 MED ORDER — MIDAZOLAM HCL 1 MG/ML IJ SOLN (WRAP)
INTRAMUSCULAR | Status: AC
Start: 2020-08-31 — End: ?
  Filled 2020-08-31: qty 2

## 2020-08-31 MED ORDER — FENTANYL CITRATE (PF) 50 MCG/ML IJ SOLN (WRAP)
INTRAMUSCULAR | Status: AC
Start: 2020-08-31 — End: ?
  Filled 2020-08-31: qty 2

## 2020-08-31 MED ORDER — PROPOFOL 10 MG/ML IV EMUL (WRAP)
INTRAVENOUS | Status: DC | PRN
Start: 2020-08-31 — End: 2020-08-31
  Administered 2020-08-31: 50 mg via INTRAVENOUS

## 2020-08-31 MED ORDER — MIDAZOLAM HCL 1 MG/ML IJ SOLN (WRAP)
INTRAMUSCULAR | Status: DC | PRN
Start: 2020-08-31 — End: 2020-08-31
  Administered 2020-08-31: 2 mg via INTRAVENOUS

## 2020-08-31 MED ORDER — VANCOMYCIN HCL 500 MG IV SOLR
INTRAVENOUS | Status: AC
Start: 2020-08-31 — End: ?
  Filled 2020-08-31: qty 500

## 2020-08-31 MED ORDER — LACTATED RINGERS IV SOLN
INTRAVENOUS | Status: DC
Start: 2020-08-31 — End: 2020-09-03

## 2020-08-31 MED ORDER — LIDOCAINE HCL (PF) 1 % IJ SOLN
INTRAMUSCULAR | Status: AC
Start: 2020-08-31 — End: ?
  Filled 2020-08-31: qty 15

## 2020-08-31 MED ORDER — AMMONIA AROMATIC IN INHA
1.0000 | Freq: Once | RESPIRATORY_TRACT | Status: DC | PRN
Start: 2020-08-31 — End: 2020-08-31

## 2020-08-31 MED ORDER — LIDOCAINE HCL 2 % IJ SOLN
INTRAMUSCULAR | Status: DC | PRN
Start: 2020-08-31 — End: 2020-08-31
  Administered 2020-08-31: 50 mg

## 2020-08-31 MED ORDER — OXYCODONE HCL 10 MG PO TABS
10.0000 mg | ORAL_TABLET | Freq: Four times a day (QID) | ORAL | Status: DC | PRN
Start: 2020-08-31 — End: 2020-09-03
  Administered 2020-09-01: 10 mg via ORAL
  Filled 2020-08-31 (×2): qty 1

## 2020-08-31 MED ORDER — BUPIVACAINE HCL 0.5 % IJ SOLN
INTRAMUSCULAR | Status: DC | PRN
Start: 2020-08-31 — End: 2020-08-31
  Administered 2020-08-31: 5 mL

## 2020-08-31 MED ORDER — PROPOFOL 10 MG/ML IV EMUL (WRAP)
INTRAVENOUS | Status: AC
Start: 2020-08-31 — End: ?
  Filled 2020-08-31: qty 20

## 2020-08-31 MED ORDER — MEPERIDINE HCL 25 MG/ML IJ SOLN
12.5000 mg | INTRAMUSCULAR | Status: DC | PRN
Start: 2020-08-31 — End: 2020-08-31

## 2020-08-31 MED ORDER — ACETAMINOPHEN 500 MG PO TABS
500.0000 mg | ORAL_TABLET | Freq: Once | ORAL | Status: DC | PRN
Start: 2020-08-31 — End: 2020-08-31

## 2020-08-31 MED ORDER — LIDOCAINE HCL 1 % IJ SOLN
INTRAMUSCULAR | Status: DC | PRN
Start: 2020-08-31 — End: 2020-08-31
  Administered 2020-08-31: 5 mL via SUBCUTANEOUS

## 2020-08-31 MED ORDER — BUPIVACAINE HCL (PF) 0.5 % IJ SOLN
INTRAMUSCULAR | Status: AC
Start: 2020-08-31 — End: ?
  Filled 2020-08-31: qty 30

## 2020-08-31 MED ORDER — OXYCODONE HCL 5 MG PO TABS
5.0000 mg | ORAL_TABLET | Freq: Once | ORAL | Status: DC | PRN
Start: 2020-08-31 — End: 2020-08-31

## 2020-08-31 MED ORDER — ONDANSETRON HCL 4 MG/2ML IJ SOLN
4.0000 mg | Freq: Once | INTRAMUSCULAR | Status: DC | PRN
Start: 2020-08-31 — End: 2020-08-31

## 2020-08-31 MED ORDER — OXYCODONE HCL 5 MG PO TABS
5.0000 mg | ORAL_TABLET | Freq: Four times a day (QID) | ORAL | Status: DC | PRN
Start: 2020-08-31 — End: 2020-09-03
  Filled 2020-08-31: qty 1

## 2020-08-31 MED ORDER — FENTANYL CITRATE (PF) 50 MCG/ML IJ SOLN (WRAP)
INTRAMUSCULAR | Status: DC | PRN
Start: 2020-08-31 — End: 2020-08-31
  Administered 2020-08-31: 100 ug via INTRAVENOUS

## 2020-08-31 MED ORDER — FENTANYL CITRATE (PF) 50 MCG/ML IJ SOLN (WRAP)
25.0000 ug | INTRAMUSCULAR | Status: DC | PRN
Start: 2020-08-31 — End: 2020-08-31

## 2020-08-31 MED ORDER — VANCOMYCIN HCL 500 MG IV SOLR
INTRAVENOUS | Status: DC | PRN
Start: 2020-08-31 — End: 2020-08-31
  Administered 2020-08-31: 500 mg

## 2020-08-31 MED ORDER — PROMETHAZINE HCL 25 MG/ML IJ SOLN
6.2500 mg | Freq: Once | INTRAMUSCULAR | Status: DC | PRN
Start: 2020-08-31 — End: 2020-08-31

## 2020-08-31 SURGICAL SUPPLY — 74 items
BANDAGE CMPR PLSTR CTTN CRTY CNFRM 75X4 (Bandage) ×1
BANDAGE COMPRESSION L75 IN X W2 IN 1 PLY (Bandage) ×1 IMPLANT
BANDAGE CONFORM STERILE 2IN (Bandage) ×1
BANDAGE CONFORM STERILE 4IN (Bandage) ×1
BANDAGE CURITY CONFORM COMPRESSION L75 IN X W4 IN 1 PLY HIGH ABSORBENT (Bandage) ×1 IMPLANT
BLADE SAW OSC 25X9MM (Blade) ×1
BLADE SAW THK.38 MM THK.43 MM OSCILLATE (Blade) ×1
BLADE SAW THK.38 MM THK.43 MM OSCILLATE SAGITTAL COMMAND II L25 MM X (Blade) ×1 IMPLANT
COVER CAM LF STRL LEN DISP CLR (Procedure Accessories) ×1
COVER CAMERA DISP STERILE (Procedure Accessories) ×1
COVER CAMERA LENS DISPOSABLE CLEAR STERILE LATEX FREE (Procedure Accessories) ×1 IMPLANT
CUFF TOURNIQUET CYLINDRICAL L18 IN X W4 IN 2 PORT BLADDER QUICK (Procedure Accessories) ×1 IMPLANT
CUFF TRNQT CYL CLR CUF 18X4IN LF STRL 2 (Procedure Accessories) ×1
DRAPE EQUIPMENT MINI C ARM 149CMX104IN 110788 (Drape) ×1 IMPLANT
DRAPE MINI C ARM (Drape) ×2
DRESSING PETRO 3% BI 3BRM GZE XR 9X5IN (Dressing) ×1
DRESSING PETROLATUM L9 IN X W5 IN NONADHESIVE OCCLUSIVE BACTERIOSTATIC (Dressing) ×1 IMPLANT
DRESSING PETROLATUM XEROFORM L8 IN X W1 (Dressing)
DRESSING PETROLATUM XEROFORM L8 IN X W1 IN 3% BISMUTH TRIBROMOPHENATE (Dressing) IMPLANT
DRESSING XEROFORM 1X8 (Dressing)
DRESSING XEROFORM STRIP 5X9IN (Dressing) ×1
ELECTRODE ADULT PATIENT RETURN L9 FT REM POLYHESIVE ACRYLIC FOAM (Procedure Accessories) ×1 IMPLANT
ELECTRODE PATIENT RETURN L9 FT VALLEYLAB (Procedure Accessories) ×1
GLOVE SURG BIOGEL SZ6.5 (Glove) ×1
GLOVE SURGICAL 6.5 BIOGEL SURGEONS (Glove) ×1
GLOVE SURGICAL 6.5 BIOGEL SURGEONS POWDER FREE BEAD CUFF MICRO ROUGHEN (Glove) ×1 IMPLANT
GLOVE SURGICAL 7 BIOGEL PI INDICATOR (Glove) ×1
GLOVE SURGICAL 7 BIOGEL PI INDICATOR UNDERGLOVE POWDER FREE SMOOTH (Glove) ×1 IMPLANT
HANDLE LIGHT ALC PLUS DISPOSABLE STERILE (Procedure Accessories) ×1
HANDLE LIGHT SNAP ON ASPEN (Procedure Accessories) ×1 IMPLANT
HANDPIECE 45D X 8MM (Instrument)
HANDPIECE PULSED LAVAGE L8 MM 45 D EXACT (Instrument)
HANDPIECE PULSED LAVAGE L8 MM 45 D EXACT NARROWER CHANNEL LOWER DECK (Instrument) IMPLANT
KIT INFECTION CONTROL CUSTOM (Kits) ×2
KIT INFECTION CONTROL CUSTOM IFOH03 (Kits) ×1 IMPLANT
MANIFOLD NEPTUNE II 4PORT SUCT (Filter) ×1
MANIFOLD SUCTION 2 STANDARD 4 PORT (Filter) ×1
MANIFOLD SUCTION 2 STANDARD 4 PORT NEPTUNE 2 WASTE MANAGEMENT SYSTEM (Filter) ×1 IMPLANT
NEEDLE 18GX1.5 (Needles) ×1
NEEDLE 25GA 1 1/2 (Needles) ×2
NEEDLE HPO SS PP RW BD 25GA 1.5IN LF (Needles) ×2 IMPLANT
NEEDLE HPO SS PP RW PRCSNGL 18GA 1.5IN (Needles) ×1
NEEDLE HYPODERMIC L1 1/2 IN OD18 GA REGULAR WALL BD PRECISIONGLIDE (Needles) ×1 IMPLANT
PACK SRG LF STRL XTRMT DISP ~~LOC~~ (Pack) ×1
PAD ELECTROSRG GRND REM W CRD (Procedure Accessories) ×1
PADDING CAST L4 YD X W3 IN UNDERCAST (Cast) ×1
PADDING CAST L4YD XW3IN UNDERCAST MILD STRETCH CHSV WEBRIL COTTON (Cast) ×1 IMPLANT
PADDING WEBRIL UNDRCST 3INX4YD (Cast) ×1
PENCIL SMOKE EVAC 70MM PSH BTN (Cautery) ×1
PENCIL SMOKE EVACUATOR COATED PUSH (Cautery) ×1
PENCIL SMOKE EVACUATOR COATED PUSH BUTTON NEPTUNE E-SEP (Cautery) ×1 IMPLANT
SCRUB POVIDONE IODINE SOL 4OZ (Scrub Supplies)
SLN PREP POVIDONE IODINE 4OZ (Scrub Supplies)
SOLUTION PREP ANTISEPTIC SCREW TOP (Scrub Supplies)
SOLUTION PREP ANTISEPTIC SCREW TOP BOTTLE LIQUID 7.5% PVP IODINE 4 OZ (Scrub Supplies) IMPLANT
SOLUTION PREP ANTISEPTIC SCRW BTTL LQD MDLN 10% PVP IODNE 4OZ DRK BRWN (Scrub Supplies) IMPLANT
SPONGE GAUZE L4 IN X W4 IN 12 PLY (Sponge) ×1 IMPLANT
SPONGE GAUZE STR 10'S 12PLY4X4 (Sponge) ×1
STAPLER APPOSE ULC 35 W SKIN (Staplers)
STAPLER SKIN L4.1 MM X W6.5 MM 35 WIDE (Staplers)
STAPLER SKIN L4.1 MM X W6.5 MM 35 WIDE STAPLE CARTRIDGE APPOSE ULC (Staplers) IMPLANT
SUTURE COATED VICRYL 2-0 SH L27 IN BRAID (Suture) ×1
SUTURE COATED VICRYL 2-0 SH L27 IN BRAID COATED UNDYED ABSORBABLE (Suture) ×1 IMPLANT
SUTURE PROLENE 2-0 SH 30IN (Suture) ×2
SUTURE PROLENE BLUE 2-0 SH L30 IN (Suture) ×2
SUTURE PROLENE BLUE 2-0 SH L30 IN MONOFILAMENT NONABSORBABLE (Suture) ×2 IMPLANT
SUTURE VICRYL 2-0 SH 27IN (Suture) ×1
SYRINGE 10 ML GRADUATE NONPYROGENIC DEHP (Syringes, Needles) ×3
SYRINGE 10 ML GRADUATE NONPYROGENIC DEHP FREE PVC FREE LOK MEDICAL (Syringes, Needles) ×3 IMPLANT
SYRINGE LUER LOCK 10CC (Syringes, Needles) ×3
TOURNIQUET 18X4 RED (Procedure Accessories) ×1
TRAY EXTREMITY ~~LOC~~ (Pack) ×1
TRAY SURGICAL EXTREMITY ~~LOC~~ (Pack) ×1 IMPLANT
UNDRGLOV SZ 7 PI INDICATR BLUE (Glove) ×1

## 2020-08-31 NOTE — Plan of Care (Signed)
BRIEF ID NOTE    Chart reviewed  Afebrile    7/21 wound CX E. coli, Enterococcus faecalis & Klebsiella pneumoniae    7/27 wound CX Enterobacter cloacae complex & Enterococcus faecalis.    PT now agreeable to amputation  To OR shortly  Anticipate clean margins/ surgical cure    Pending surgical findings/ & updated cultures ;  if clean margins  then Byron planning on po Augmentin  875/125 mg po q12 hr on discharge through 8/5    He can follow up with his primary care/ his physicians  in [place]    Will follow peripherally over the weekend    [place], MD

## 2020-08-31 NOTE — Op Note (Addendum)
FULL OPERATIVE NOTE    Date Time: 08/31/20 5:32 PM  Patient Name: Daniel Hebert  Attending Physician: Daniel Guan, MD      Date of Operation:   08/29/2020    Providers Performing:   Surgeon(s):  Daniel Hebert, DPM    Circulator: Daniel Edin, RN  Scrub Person: Daniel Hebert  Preceptor: Daniel Reaper, RN    Operative Procedure:   Procedure(s):  BONE BIOPSY OF SECOND TOE WITH C-ARM FLUOROSCOPY  INCISION AND DRAINAGE OF ULCER RIGHT FOOT    Preoperative Diagnosis:   Pre-Op Diagnosis Codes:     * Osteomyelitis of right foot, unspecified type [M86.9]     * Ulcer of right foot, unspecified ulcer stage [L97.519]    Postoperative Diagnosis:   Post-Op Diagnosis Codes:     * Osteomyelitis of right foot, unspecified type [M86.9]     * Ulcer of right foot, unspecified ulcer stage [L97.519]    Indications:   60 year old male presents with worsening right second toe infection with pathological fracture on xray of the distal phalanx and MRI shows signs of osteomyelitis of the distal and middle phalanx. Prior to admission patient states he had admitted to walking in his boots in rain water recently and since then he noted that the infection got worse. Patient was refusing amputation and I was brought in as a second opinion. I discussed various treatment options conservative and surgical options. Patient was advised to consider an amputation given the clinical presentation and imaging that was obtained. Patient opted to proceed with surgery in a staged manner so he could make a better decision about doing an amputation. Risks, benefits, complications, and alternatives were discussed in great detail. No guarantees are given regarding outcome since this is diabetic limb salvage.     Patient signed an informed consent prior to the procedure.     Operative Notes:   Patient was brought into the operating room and placed in supine position. Following IV sedation a formal timeout was performed to identify the patient and  procedure. Then at this time 10 mL of 1:1 mixture of 1% lidocaine and 0.5% Marcaine plain was injected to the right foot. The right lower extremity was prepped draped in usual aseptic manner.     Attention was directed to the right second toe. There was an open sinus tract probing deep. An elliptical incision was made around the sinus tract. Then using a hemostat to see how far it was probing was confirmed with c-arm images. It was probing to the base of the middle phalanx.  Then a deep wound culture was obtained from the right second toe. The distal phalanx was inspected through the incision and was noted to fragmented and soft. Using a Jamshidi needle bone biopsy was obtained from the distal phalanx, a piece was sent to microbiology and pathology.    Then using c-arm, small stab incision was made overlying the distal aspect of the middle phalanx dorsally. Using another Jamshidi needle was used to obtain a bone culture for microbiology and another piece for pathology of the middle phalanx of the right second toe.    Surgical site was then irrigated with Irrisept. Top gloves were exchanged and prior instrumentation used was put to the side. Next vancomycin powder was placed in the wound bed. Then 1/4 inch iodoform packing was placed in the incision sites. Followed by 4x4 gauze, ABD pad, webril, and ACE wrap.    Patient tolerated the procedure and anesthesia well. He  was transferred from operating room to recovery area with vital signs stable and neurovascular status intact to the right foot. Following a period of post operative monitoring patient will be given written and oral instructions, but based off intraoperative findings, medically patient will benefit from an amputation, but will respect patient's wishes to await intraoperative culture results. Patient was told prior to surgery if microbiology or pathology results are positive that I would recommend removal of infected bone through an amputation which could  be partial if he decides to act sooner rather than later as the bone infection can spread.       Estimated Blood Loss:   <25 mL  Hemostasis: Anatomical dissection    Implants:   * No implants in log *    Drains:   Drains: packing material    Specimens:     ID Type Source Tests Collected by Time Destination   A : Right second intermediate phalanx bone Biopsy Bone SURGICAL PATHOLOGY Daniel Hebert, DPM 08/29/2020 1809    B : Right second distal phalanx bone Biopsy Bone SURGICAL PATHOLOGY Daniel Hebert, DPM 08/29/2020 1816          Complications:   None. C-arm images unfortunately were not able to be retrieved from the mini- c arm, but it was confirmed with intraoperative staff they were saved and when attempted to print them they could not be found.     Signed by: Daniel Hebert, DPM,

## 2020-08-31 NOTE — Op Note (Signed)
FULL OPERATIVE NOTE    Date Time: 08/31/20 5:51 PM  Patient Name: Daniel Hebert  Attending Physician: Marry Guan, MD      Date of Operation:   08/31/2020    Providers Performing:   Surgeon(s):  Reita Chard, DPM    Circulator: Mervyn Gay, RN  Scrub Person: Jenell Milliner    Operative Procedure:   Right second toe partial amputation   C-arm fluoroscopy     Preoperative Diagnosis:   Pre-Op Diagnosis Codes:     * Right foot ulcer, with necrosis of muscle [L97.513]     * Diabetic polyneuropathy associated with type 2 diabetes mellitus [E11.42]     * Acute osteomyelitis of toe, right [M86.171]    Postoperative Diagnosis:   Post-Op Diagnosis Codes:     * Right foot ulcer, with necrosis of muscle [L97.513]     * Diabetic polyneuropathy associated with type 2 diabetes mellitus [E11.42]     * Acute osteomyelitis of toe, right [M86.171]    Indications:   60 year old male presents with worsening right second toe infection with pathological fracture on xray of the distal phalanx and MRI shows signs of osteomyelitis of the distal and middle phalanx. Prior to admission patient states he had admitted to walking in his boots in rain water recently and since then he noted that the infection got worse. Patient was refusing amputation and I was brought in as a second opinion. I discussed various treatment options conservative and surgical options. Patient was advised to consider an amputation given the clinical presentation and imaging that was obtained. Patient opted to proceed with surgery in a staged manner so he could make a better decision about doing an amputation. I reviewed microbiology results, clinical presentation of the toe that was still significantly edematous with sinus tract probing deep with the patient and discussed he does have bacteria in the distal phalanx discussed in length that I would recommend amputation to ensure that all the infected bone is removed and enough bone to allow for proper  closure. This conversation was discussed in length bedside with the patient and case manager Dudley Major was also bedside to witness this conversation with the patient. Patient was finally amendable to a partial right second toe amputation. Risks, benefits, complications, and alternatives were discussed in great detail. No guarantees are given regarding outcome since this is diabetic limb salvage.      Patient signed an informed consent prior to the procedure.      Operative Notes:   Patient was brought into the operating room and placed in supine position. Following IV sedation a formal timeout was performed to identify the patient and procedure. Then at this time 10 mL of 1:1 mixture of 1% lidocaine and 0.5% Marcaine plain was injected to the right foot. The right lower extremity was prepped draped in usual aseptic manner.      Attention was directed to the right second toe. There was an open sinus tract probing deep and new deep right second toe culture was obtained and sent to microbiology. Using a #15 blade racquet shaped incision was made overlying the right second proximal phalangeal joint confirmed on c-arm. An attempt was made to disarticulate the joint , but was pseudoarthrosed so it was done using a sagittal saw and confirmed that all the distal and middle phalanx was resected. This would also allow for proper skin closure.  Remaining second proximal phalanx bone was glistening, healthy, hard, and viable.  Surgical site was then irrigated with Irrisept. Top gloves were exchanged and prior instrumentation used was put to the side. Next vancomycin powder was placed in the wound bed. Then 3-0 Vicryl was used to close deep, followed by 3-0 Nylon was skin closure.Surgical site was dressed with xeroform, gauze, webril, ACE wrap.      Patient tolerated the procedure and anesthesia well. He was transferred from operating room to recovery area with vital signs stable and neurovascular status intact to the  right foot. Following a period of post operative monitoring patient will be given written and oral instructions.    Estimated Blood Loss:   * No values recorded between 08/31/2020  4:40 PM and 08/31/2020  5:29 PM *  <25 mL.  Anatomical dissection   Implants:   * No implants in log *    Drains:   Drains: no    Specimens:     ID Type Source Tests Collected by Time Destination   A : right food- second toe  Non-Traumatic Amputation Toe SURGICAL PATHOLOGY Reita Chard, DPM 08/31/2020 1701          Complications:   None     Signed by: Killian Schwer Ammie Dalton, DPM,

## 2020-08-31 NOTE — Plan of Care (Signed)
Problem: Safety  Goal: Patient will be free from injury during hospitalization  Outcome: Progressing  Goal: Patient will be free from infection during hospitalization  Outcome: Progressing     Problem: Pain  Goal: Pain at adequate level as identified by patient  Outcome: Progressing     Problem: Side Effects from Pain Analgesia  Goal: Patient will experience minimal side effects of analgesic therapy  Outcome: Progressing     Problem: Discharge Barriers  Goal: Patient will be discharged home or other facility with appropriate resources  Outcome: Progressing     Problem: Psychosocial and Spiritual Needs  Goal: Demonstrates ability to cope with hospitalization/illness  Outcome: Progressing     Problem: Compromised Tissue integrity  Goal: Damaged tissue is healing and protected  Outcome: Progressing  Goal: Nutritional status is improving  Outcome: Progressing     Problem: Diabetes: Glucose Imbalance  Goal: Blood glucose stable at established goal  Outcome: Progressing     Problem: Moderate/High Fall Risk Score >5  Goal: Patient will remain free of falls  Outcome: Progressing

## 2020-08-31 NOTE — Anesthesia Preprocedure Evaluation (Signed)
Anesthesia Evaluation    AIRWAY    Mallampati: II    TM distance: >3 FB  Neck ROM: full  Mouth Opening:full  Planned to use difficult airway equipment: No CARDIOVASCULAR    cardiovascular exam normal       DENTAL    no notable dental hx     PULMONARY    pulmonary exam normal     OTHER FINDINGS                  Relevant Problems   OTHER   (+) Acute osteomyelitis of toe, right               Anesthesia Plan    ASA 3     general               (Risks discussed including but not limited to:    - Neurological complications such as stroke, TIA  - Cardiovascular complications such as heart attack, Arrhythmias, Cardiac arrest    - Pulmonary complications such as Asthmatic attack,Pulm. Aspiration, Bronchospasm and Pneumonia  - Intra-operative awareness,  - Dental Injuries  - Sore Throat.  - Allergic reactions.  - Death.     Anesthesia explained and Questions answered.     Pt understands and wishes to proceed.    Darl Householder D.O.)      intravenous induction   Detailed anesthesia plan: general endotracheal, general IV, MAC, general mask and general LMA      Post Op: plan for postoperative opioid use    Post op pain management: per surgeon    informed consent obtained    Plan discussed with CRNA.    ECG reviewed  pertinent labs reviewed             Signed by: Algernon Huxley, DO 08/31/20 4:15 PM

## 2020-08-31 NOTE — Transfer of Care (Signed)
Pt awake, breathing spontaneously, VSS.  Report given to RN.

## 2020-08-31 NOTE — Progress Notes (Signed)
CM and physician at bedside with Mr. Bonenberger.  Mr. Moreland was given printed prelim culture reading of toe in hand and after reading pt decided he would go ahead with amputation of toe today.   Discussion made in detail by physician on how much of toe might be taken and detail on closure. Mr. Hazard was in agreement with details of the surgery and no other questions noted at this time.    Dudley Major RN  Case Management  [phone]

## 2020-08-31 NOTE — Plan of Care (Signed)
Problem: Safety  Goal: Patient will be free from injury during hospitalization  Outcome: Progressing  Flowsheets (Taken 08/31/2020 0227)  Patient will be free from injury during hospitalization:   Assess patient's risk for falls and implement fall prevention plan of care per policy   Provide and maintain safe environment   Ensure appropriate safety devices are available at the bedside   Include patient/ family/ care giver in decisions related to safety     Problem: Psychosocial and Spiritual Needs  Goal: Demonstrates ability to cope with hospitalization/illness  Outcome: Progressing  Flowsheets (Taken 08/31/2020 0227)  Demonstrates ability to cope with hospitalizations/illness:   Encourage verbalization of feelings/concerns/expectations   Provide quiet environment   Encourage participation in diversional activity   Include patient/ patient care companion in decisions     Problem: Compromised Tissue integrity  Goal: Damaged tissue is healing and protected  Outcome: Progressing  Flowsheets (Taken 08/29/2020 2353)  Damaged tissue is healing and protected:   Monitor patient's hygiene practices   Monitor external devices/tubes for correct placement to prevent pressure, friction and shearing   Keep intact skin clean and dry   Increase activity as tolerated/progressive mobility

## 2020-08-31 NOTE — Progress Notes (Signed)
[facility]  HOSPITALIST  PROGRESS NOTE      Patient: Daniel Hebert  Date: 08/31/2020   LOS: 10 Days  Admission Date: 08/21/2020   MRN: [id number]  Attending: Marry Guan, MD       ASSESSMENT/PLAN     Daniel Hebert is a 60 y.o. male admitted with below       Osteomyelitis and Diabetic ulcer right second toe, cellulitis right foot, Charcot osteoarthropathy midfoot RT:  -- Continue current antibiotics with Zosyn.  Initial wound cultures with light growth of E. coli, E faecalis and Klebsiella  - Post bone biopsy.  Postop cultures with Enterobacter and Enterococcus.    -- Patient now agreeable to partial amputation.  Appreciate podiatry input.    -- Dressing will remain in place till follow-up in clinic either at 8 3 or 8/5.  Patient will need postop shoe.  PT OT will be consulted.  - Arterial duplex negative  Discharge antibiotics determined postoperatively pending report.  Will follow with ID.  Is a poor candidate for home IV antibiotics .      Uncontrolled DM: Blood sugar elevated.  Refuses to take medications including oral or insulin.. Reports he takes herbs. A1C 8.7.  Will change Accu-Cheks to twice daily  Uncontrolled HTN: Profile generally improved.  Continue amlodipine  Possible medical non compliance, Concern for psychiatric disorder: Seen by psychiatry, deemed capable of making his own decisions.  Multiple grievances to staff and treatment team.  Continue supportive care as possible.  Follow-up psychiatry as needed .    History of meningioma: Outpatient follow-up  Stable hyponatremia likely due to hyperglycemia      Dispo: Likely home ,pending antibiotic determination post partial amputation.      Patient Lines/Drains/Airways Status       Active PICC Line / CVC Line / PIV Line / Drain / Airway / Intraosseous Line / Epidural Line / ART Line / Line / Wound / Pressure Ulcer / NG/OG Tube       Name Placement date Placement time Site Days    Peripheral IV 08/21/20 20 G Standard Left Antecubital 08/21/20   1730  Antecubital  1                             Care Plan discussed with nursing, and case manager       Patient seen and examined today at bedside . no acute complaints    MEDICATIONS     Current Facility-Administered Medications   Medication Dose Route Frequency    [MAR Hold] amLODIPine  10 mg Oral Daily    [MAR Hold] cadexomer iodine   Topical Daily    [MAR Hold] carboxymethylcellulose (PF)  1 drop Both Eyes QID    [MAR Hold] docusate sodium  100 mg Oral Daily    [MAR Hold] heparin (porcine)  5,000 Units Subcutaneous Q12H [facility]    [MAR Hold] insulin glargine  20 Units Subcutaneous QHS    [MAR Hold] insulin lispro  1-3 Units Subcutaneous QHS    [MAR Hold] insulin lispro  1-5 Units Subcutaneous TID AC    [MAR Hold] insulin lispro  3 Units Subcutaneous Once    [MAR Hold] piperacillin-tazobactam  4.5 g Intravenous Q8H    [MAR Hold] polyethylene glycol  17 g Oral Daily       ROS       Neurological: No headache or dizziness  Respiratory: No cough or shortness of breath  Cardiovascular:  No chest pain      PHYSICAL EXAM     Vitals:    08/31/20 1120   BP: (!) 166/99   Pulse: 95   Resp: 17   Temp: 98.1 F (36.7 C)   SpO2: 97%       Temperature: Temp  Min: 97.3 F (36.3 C)  Max: 98.2 F (36.8 C)  Pulse: Pulse  Min: 71  Max: 99  Respiratory: Resp  Min: 15  Max: 18  Non-Invasive BP: BP  Min: 133/82  Max: 166/99  Pulse Oximetry SpO2  Min: 94 %  Max: 99 %    Intake and Output Summary (Last 24 hours) at Date Time    Intake/Output Summary (Last 24 hours) at 08/31/2020 1615  Last data filed at 08/31/2020 0730  Gross per 24 hour   Intake 600 ml   Output --   Net 600 ml         GEN APPEARANCE: Alert and oriented   CVS: RRR,  LUNGS: CTAB; No Wheezes; No Rhonchi: No rales  Vascular: Dorsalis pedis pulses are palpable  ABD: Soft; No TTP  EXT: Right foot dressing in place.   NEURO: Nonfocal.  Psych: Patient is cooperative, positive some paranoid and grandiose thoughts but no hallucination or delusions    LABS     Recent Labs   Lab  08/28/20  0547 08/27/20  0524 08/26/20  0709   WBC 6.42 6.28 6.22   RBC 5.04 5.01 5.14   Hgb 14.9 14.8 15.5   Hematocrit 43.6 43.1 44.0   MCV 86.5 86.0 85.6   Platelets 333 300 312       Recent Labs   Lab 08/30/20  0614 08/28/20  1003 08/28/20  0547 08/27/20  0524 08/26/20  0709 08/25/20  0800   Sodium 134*  --  133* 134* 133* 135*   Potassium 4.7 4.7 5.7* 5.0 4.9 4.9   Chloride 100  --  101 102 104 104   CO2 24  --  24 23 20* 25   BUN 14  --  13 15 12 14    Creatinine 1.2  --  1.2 1.1 0.9 1.1   Glucose 268*  --  277* 282* 270* 291*   Calcium 10.1  --  9.9 9.4 9.8 9.9   Magnesium  --   --  2.0 1.8 1.9 2.0                             Microbiology Results (last 15 days)       Procedure Component Value Units Date/Time    Culture + Gram Azzie Glatter Wound [[id number]] Collected: 08/29/20 1816    Order Status: Completed Specimen: Wound from Bone Updated: 08/31/20 0816    Narrative:      ORDER#: [id number]                                    ORDERED BY: PATEL, SHRUTI  SOURCE: Bone Right second intermediate phala         COLLECTED:  08/29/20 18:16  ANTIBIOTICS AT COLL.:                                RECEIVED :  08/30/20 01:11  Stain, Gram  FINAL       08/30/20 05:18  08/30/20   No WBCs or organisms seen             No Squamous epithelial cells seen  Culture and Gram Stain, Aerobic, Wound     PRELIM      08/31/20 08:16  08/31/20   Culture no growth to date, Final report to follow      Culture, Anaerobic Bacteria [[id number]] Collected: 08/29/20 1816    Order Status: Sent Specimen: Other from Bone Updated: 08/30/20 0112    Culture + Gram Stain,Aerobic, Wound [[id number]] Collected: 08/29/20 1810    Order Status: Completed Specimen: Wound from Bone Updated: 08/31/20 1017    Narrative:      ORDER#: [id number]                                    ORDERED BY: Allena Katz, SHRUTI  SOURCE: Bone Right second distal phalanx bon         COLLECTED:  08/29/20 18:10  ANTIBIOTICS AT COLL.:                                 RECEIVED :  08/30/20 01:11  Stain, Gram                                FINAL       08/30/20 05:17  08/30/20   No WBCs or organisms seen             No Squamous epithelial cells seen  Culture and Gram Stain, Aerobic, Wound     PRELIM      08/31/20 10:17   +  08/31/20   Very light growth of Enterobacter cloacae complex               Further workup to follow including susceptibility testing    08/31/20   Very light growth of Enterococcus faecalis               Further workup to follow including susceptibility testing        Culture, Anaerobic Bacteria [[id number]] Collected: 08/29/20 1810    Order Status: Sent Specimen: Other from Bone Updated: 08/30/20 0112    Culture, Anaerobic Bacteria [[id number]] Collected: 08/29/20 1805    Order Status: Sent Specimen: Other from Ulcer Updated: 08/30/20 0057    Culture + Gram Azzie Glatter Wound [[id number]] Collected: 08/29/20 1805    Order Status: Completed Specimen: Wound from Ulcer Updated: 08/31/20 1001    Narrative:      ORDER#: [id number]                                    ORDERED BY: Allena Katz, SHRUTI  SOURCE: Ulcer Right second toe culture               COLLECTED:  08/29/20 18:05  ANTIBIOTICS AT COLL.:                                RECEIVED :  08/30/20 00:57  Stain, Gram  FINAL       08/30/20 03:26  08/30/20   Few WBCs             No organisms seen             No Squamous epithelial cells seen  Culture and Gram Stain, Aerobic, Wound     PRELIM      08/31/20 10:01   +  08/31/20   Very light growth of Enterobacter cloacae complex               Further workup to follow including susceptibility testing        Microbiology additional request [[id number]] Collected: 08/25/20 0952    Order Status: Completed Specimen: Blood from Wound Updated: 08/25/20 0957    Narrative:      [id number] - Ecoli sensitivities, thanks.  ORDER#: [id number]                                    ORDERED BY: Rayetta Humphrey, TREVOR  SOURCE: Wound wound                                   COLLECTED:  08/25/20 09:52  ANTIBIOTICS AT COLL.:                                RECEIVED :  08/25/20 09:52  Microbiology Add On Request                FINAL       08/25/20 09:57  08/25/20   For results, see #[id number]      Culture + Gram Azzie Glatter, Wound [[id number]] Collected: 08/23/20 1724    Order Status: Completed Specimen: Wound from Abrasion Updated: 08/27/20 1553    Narrative:      ORDER#: [id number]                                    ORDERED BY: Elise Benne  SOURCE: Abrasion R 2nd toe                           COLLECTED:  08/23/20 17:24  ANTIBIOTICS AT COLL.:                                RECEIVED :  08/23/20 22:27  Stain, Gram                                FINAL       08/24/20 00:32   +  08/24/20   Few WBCs             No Squamous epithelial cells seen             Many Gram positive cocci  Culture and Gram Stain, Aerobic, Wound     FINAL       08/27/20 15:53   +  08/24/20   Moderate growth of mixed cutaneous flora  08/27/20   Light growth of Escherichia coli      08/26/20   Moderate growth of Enterococcus faecalis  08/27/20   Very light growth of Klebsiella pneumoniae      _____________________________________________________________________________                                     E.coli        E.faecalis     K.pneumoniae    ANTIBIOTICS                     MIC  INTRP      MIC  INTRP      MIC  INTRP      _____________________________________________________________________________  Amoxicillin/CA                 <=4/2   S                       <=4/2   S        Ampicillin                      <=4    S         2     S        >16    R        Ampicillin/sulbactam            4/2    S                        8/4    S        Aztreonam                       <=2    S                        <=2    S        Cefazolin                        2     S                         2     S        Cefepime                        <=1    S                        <=1    S        Cefoxitin                       <=4    S                         <=4    S        Ceftazidime                     <=2    S                        <=2    S  Ceftriaxone                     <=1    S                        <=1    S        Cefuroxime                      <=4    S                        <=4    S        Ciprofloxacin                 <=0.25   S                      <=0.25   S        Ertapenem                     <=0.25   S                      <=0.25   S        Gentamicin                      <=2    S                        <=2    S        Gentamicin High Level Resistan                 <=500   S                        Levofloxacin                   <=0.5   S                       <=0.5   S        Meropenem                      <=0.5   S                       <=0.5   S        Penicillin                                       4     S                        Piperacillin/Tazobactam        <=2/4   S                        4/4    S        Streptomycin High Level Resist                <=1000   S  Tetracycline                    <=2    S                        <=2    S        Trimethoprim/Sulfamethoxazole <=0.5/9  S                      <=0.5/9  S        Vancomycin                                       1     S                        _____________________________________________________________________________            S=SUSCEPTIBLE     I=INTERMEDIATE     R=RESISTANT                            N/S=NON-SUSCEPTIBLE  _____________________________________________________________________________      Additional Culture Request [[id number]] Collected: 08/23/20 1724    Order Status: Completed Specimen: Abrasion Updated: 08/25/20 0956    Narrative:      ORDER#: [id number]                                    ORDERED BY: Elise Benne  SOURCE: Abrasion R 2nd toe                           COLLECTED:  08/23/20 17:24  ANTIBIOTICS AT COLL.:                                RECEIVED :  08/23/20 22:27  Additional Culture Request                  FINAL       08/25/20 09:56  08/25/20   E. coli susceptibility requested by physician:             Dr. Fredia Beets on Order [phone]      MRSA culture [[id number]] Collected: 08/22/20 2133    Order Status: Completed Specimen: Culturette from Nasal Swab Updated: 08/23/20 2038     Culture MRSA Surveillance Negative for Methicillin Resistant Staph aureus    MRSA culture [[id number]] Collected: 08/22/20 2133    Order Status: Completed Specimen: Culturette from Throat Updated: 08/23/20 2038     Culture MRSA Surveillance Negative for Methicillin Resistant Staph aureus    Culture Blood Aerobic and Anaerobic [[id number]] Collected: 08/21/20 1809    Order Status: Completed Specimen: Arm from Blood, Venipuncture Updated: 08/27/20 0321    Narrative:      ORDER#: [id number]                                    ORDERED BY: Kelby Aline  SOURCE: Blood, Venipuncture Arm                      COLLECTED:  08/21/20 18:09  ANTIBIOTICS AT COLL.:                                RECEIVED :  08/22/20 00:57  Culture Blood Aerobic and Anaerobic        FINAL       08/27/20 03:21  08/27/20   No growth after 5 days of incubation.      Culture Blood Aerobic and Anaerobic [[id number]] Collected: 08/21/20 1736    Order Status: Completed Specimen: Arm from Blood, Venipuncture Updated: 08/27/20 0321    Narrative:      ORDER#: [id number]                                    ORDERED BY: Kelby Aline  SOURCE: Blood, Venipuncture Arm                      COLLECTED:  08/21/20 17:36  ANTIBIOTICS AT COLL.:                                RECEIVED :  08/22/20 00:57  Culture Blood Aerobic and Anaerobic        FINAL       08/27/20 03:21  08/27/20   No growth after 5 days of incubation.               RADIOLOGY     Radiological Procedure personally reviewed and concur with radiologist reports unless stated otherwise.    US Arterial / Graft Duplex Doppler Lower Extremity Right   Final Result       1. Right lower extremity: No arterial insufficiency of the right lower    extremity at rest with triphasic distal tibial waveforms and normal   waveforms of the right second toe.   2. Left lower extremity: No arterial insufficiency of the left lower   extremity at rest.      Adelina Mings, MD    08/24/2020 10:05 AM      [place] Noninvas Low Extrem Art Dopp/Press/Wavefrms (PVR) Comp 3-4 Levels   Final Result       1. Right lower extremity: No arterial insufficiency of the right lower   extremity at rest with triphasic distal tibial waveforms and normal   waveforms of the right second toe.   2. Left lower extremity: No arterial insufficiency of the left lower   extremity at rest.      Adelina Mings, MD    08/24/2020 10:05 AM      MRI Foot Right W WO Contrast   Final Result      1.  Osseous erosion and fragmentation of the second toe distal phalanx   with abnormal marrow edema in the second toe middle and distal   phalanges, compatible with osteomyelitis.      2.  Degenerative changes of the midfoot with adjacent osseous erosions   and extensive marrow edema/enhancement. These findings are likely due to   Charcot arthropathy, although superimposed infection is in region is   difficult to exclude.      3.  Diffuse subcutaneous edema and mild enhancement throughout the foot,   especially in the second toe, compatible with cellulitis. No discrete   abscess or drainable fluid collection is identified.  These urgent results were discussed with and acknowledged by Burnett Kanaris, DO, on 08/22/2020 at 2:50 PM.      Carla Drape, MD    08/22/2020 2:50 PM      Foot Right AP Lateral And Oblique   Final Result         1.  Abnormal lucency, erosion, and fragmentation of the second toe   distal phalanx, most likely representing osteomyelitis with superimposed   pathologic fracture. There is pronounced soft tissue swelling of the   second toe and dorsal foot.      2.  Abnormal lateral subluxation of the second TMT joint and, to a   slightly lesser extent, third and fourth TMT joints, concerning for   Lisfranc  ligamentous injury. There are associated degenerative changes   and ossification along the dorsal midfoot, which may be reflective of   developing Charcot arthropathy.      3.  NOTIFICATION: The critical findings of this study were discussed   with, and acknowledged by, Dr. Kelby Aline via telephone at 6:53 PM on   08/21/2020.      Vassie Moment, MD    08/21/2020 6:54 PM          Signed,  Marry Guan, MD  4:15 PM 08/31/2020

## 2020-08-31 NOTE — Progress Notes (Signed)
F/U visit completed to reassess right 2nd toe wound. Patient scheduled for surgery today at 1600, cultures taken on Wednesday still pending.  Dressing to right foot removed. Packing strip removed from wound bed, cleansed with normal saline. Wound measuring 0.8 cm x 0.3 cm x 0.5 cm, bone visible. Erythema and edema significantly decreased. Iodosorb applied, new packing strip probed into wound bed, and covered with curity non adherent layer, gauze and wrapped with Kerlix gauze roll.   Lengthy conversation had with patient regarding hospital stay and decision to undergo surgery. Patient still deciding, aware of surgery scheduled for 1600.  Recommend to continue daily packing dressing changes if patient does not decide to undergo surgery. If patient does undergo surgery, defer management to podiatry.  Discussed with primary RN. WOC service will continue to follow and assist as necessary during this hospitalization.

## 2020-08-31 NOTE — Consults (Signed)
Psychiatry Progress Note    Patient name:  Daniel Hebert, Daniel Hebert  Date of admission:  08/21/2020  Date of birth:  [birth year]                       Date of consultation:  08/31/2020  Age:  60 y.o.                                     Attending/Referring Physician:  Marry Guan, MD  MRN:  [id number]                                   Consulting Physician:  Violeta Gelinas, MD  CSN:  [id number]                                    Guardian/POA: Friend      Reason for Admission    Foot pain, cellulitis of lower extremity,    Active Hospital Problems    Diagnosis    Cellulitis of left lower extremity    Right foot ulcer, with necrosis of muscle    Diabetic polyneuropathy associated with type 2 diabetes mellitus    Acute osteomyelitis of toe, right       Reason for Initial Consultation    Evaluation for capacity, refusing medication management and care, patient is found to have denies in his room    I. History     Interval History  Daniel Hebert is a 60 y.o. 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 Right Foot OM, DM Type II, HTN, HLD, Hx. Meningioma, GERD who presented with Right Foot Pain.     Patient reports for the past 4-5 days he has had progressive redness, pain, and swelling of his right foot. He has had chronic foot ulcer and recurrent infections in the past and has been evaluated by many different Podiatrists and Ortho Ankle/Foot physicians recently. He had MRI recently which showed chronic stable changes related to the foot and no acute OM or septic arthritis. The patient is determined not to undergo any amputations and has poor opinion of Podiatry as a field stating that they have mismanaged his condition. He was seen by Foot/Ankle Ortho Dr. Mosetta Putt who recommended coming to the hospital for IV Abx and Podiatry evaluation. In the ED, hemodynamically stable, started on Vanc+Zosyn and admitted for further management. On my discussion with the patient, he was very  resistant to having Podiatry consult, but on further discussion, has agreed to evaluation by them in the AM.  Patient reportedly drove to this area from [place] with his dog    Patient reportedly drove from [place] to this area with his dog in the car left the dog in the car and heart son of couple days while he is in the hospital.  The dog is subsequently rescued and currently in the pond.  Police charges are pending     [id number] smoking tobacco, drinking alcohol, or using illicit drugs.  His ex-wife lives in his    A psychiatric consult was initiated as patient is refusing treatment angry agitated paranoid delusional agitated demanding argumentative and a capacity evaluation is recommended.  Patient is willing to take IV antibiotics  Does not want any p.o. medications for diabetes mellitus, hypertension, any other issues    Medical history is significant for hypertensive cardiovascular disorder, right lower extremity cellulitis, infection, osteomyelitis, diabetes mellitus, hypercholesterolemia, history of brain tumor /meningioma, GERD, poor self-care    08/31/2020  Interval history  Patient is alert oriented to person place and time  Appeared to be disorganized argumentative  Eating well sleeping well  Patient is scheduled for a surgical procedure today 08/29/2020  Appeared to have multiple complaints disorganized paranoid  Patient is not suicidal not homicidal cooperative contract for safety  Patient is difficult disorganized argumentative  Discharge plans and outpatient follow-up plans recommended when medically stable  Patient may be discharged home when medically stable with outpatient follow-up plans  Patient is uncooperative for medication management and treatment for his diabetes and bipolar mood disorder    Chief Complaint:   Mood swings impaired insight and judgment, agitation paranoia severe thought disorder impaired insight and judgment unable to care for himself  Additional History and  Collateral Information:   No new additional history and collateral information could be obtained at this time  Medical Review of Systems  Poorly cooperative  All other pertinent Review of Systems negative.    Allergies   Allergen Reactions    Metformin Other (See Comments)    Mirtazapine Other (See Comments)    Molds & Smuts      difficulity breathing     Current Facility-Administered Medications   Medication Dose Route Frequency    amLODIPine  10 mg Oral Daily    cadexomer iodine   Topical Daily    carboxymethylcellulose (PF)  1 drop Both Eyes QID    docusate sodium  100 mg Oral Daily    heparin (porcine)  5,000 Units Subcutaneous Q12H SCH    insulin glargine  20 Units Subcutaneous QHS    insulin lispro  1-3 Units Subcutaneous QHS    insulin lispro  1-5 Units Subcutaneous TID AC    insulin lispro  3 Units Subcutaneous Once    piperacillin-tazobactam  4.5 g Intravenous Q8H    polyethylene glycol  17 g Oral Daily         II. Examination   Vital signs reviewed:   Blood pressure (!) 166/99, pulse 95, temperature 98.1 F (36.7 C), temperature source Oral, resp. rate 17, height 1.905 m (6\' 3" ), weight 100.7 kg (222 lb), SpO2 97 %.           The physical exam performed by the medical team reviewed.    Laboratory Data and Diagnostic Studies  Lab Results last 48 Hours       Procedure Component Value Units Date/Time    Glucose Whole Blood - POCT [[id number]]  (Abnormal) Collected: 08/23/20 1113     Updated: 08/23/20 1118     Whole Blood Glucose POCT 313 mg/dL     Magnesium [[id number]] Collected: 08/23/20 0618    Specimen: Blood Updated: 08/23/20 0811     Magnesium 1.9 mg/dL     Comprehensive metabolic panel [[id number]]  (Abnormal) Collected: 08/23/20 0618    Specimen: Blood Updated: 08/23/20 0811     Glucose 274 mg/dL      BUN 11 mg/dL      Creatinine 0.9 mg/dL      Calcium 9.0 mg/dL      Sodium 366 mEq/L      Potassium 4.3 mEq/L      Chloride 105 mEq/L      CO2 22 mEq/L  Anion Gap 8.0     Protein, Total 6.3 g/dL       Albumin 3.0 g/dL      AST (SGOT) 18 U/L      ALT 30 U/L      Alkaline Phosphatase 81 U/L      Bilirubin, Total 0.7 mg/dL      Globulin 3.3 g/dL      Albumin/Globulin Ratio 0.9    GFR [[id number]] Collected: 08/23/20 0618     Updated: 08/23/20 0811     EGFR >60.0       Glucose Whole Blood - POCT [[id number]]  (Abnormal) Collected: 08/23/20 0729     Updated: 08/23/20 0744     Whole Blood Glucose POCT 247 mg/dL     CBC without differential [[id number]]  (Abnormal) Collected: 08/23/20 0618    Specimen: Blood Updated: 08/23/20 0651     WBC 4.15 x10 3/uL      Hgb 13.3 g/dL      Hematocrit 08.6 %      Platelets 231 x10 3/uL      RBC 4.63 x10 6/uL      MCV 86.4 fL      MCH 28.7 pg      MCHC 33.3 g/dL      RDW 12 %      MPV 8.7 fL      Nucleated RBC 0.0 /100 WBC      Absolute NRBC 0.00 x10 3/uL     Culture Blood Aerobic and Anaerobic [[id number]] Collected: 08/21/20 1809    Specimen: Arm from Blood, Venipuncture Updated: 08/23/20 0121    Narrative:      ORDER#: [id number]                                    ORDERED BY: Kelby Aline  SOURCE: Blood, Venipuncture Arm                      COLLECTED:  08/21/20 18:09  ANTIBIOTICS AT COLL.:                                RECEIVED :  08/22/20 00:57  Culture Blood Aerobic and Anaerobic        PRELIM      08/23/20 01:21  08/23/20   No Growth after 1 day/s of incubation.      Culture Blood Aerobic and Anaerobic [[id number]] Collected: 08/21/20 1736    Specimen: Arm from Blood, Venipuncture Updated: 08/23/20 0121    Narrative:      ORDER#: [id number]                                    ORDERED BY: Kelby Aline  SOURCE: Blood, Venipuncture Arm                      COLLECTED:  08/21/20 17:36  ANTIBIOTICS AT COLL.:                                RECEIVED :  08/22/20 00:57  Culture Blood Aerobic and Anaerobic        PRELIM      08/23/20 01:21  08/23/20   No Growth after 1 day/s  of incubation.      MRSA culture [[id number]] Collected: 08/22/20 2133    Specimen: Culturette from Nasal Swab Updated:  08/23/20 0014    MRSA culture [[id number]] Collected: 08/22/20 2133    Specimen: Culturette from Throat Updated: 08/23/20 0014    Glucose Whole Blood - POCT [[id number]]  (Abnormal) Collected: 08/22/20 2107     Updated: 08/22/20 2122     Whole Blood Glucose POCT 329 mg/dL     Glucose Whole Blood - POCT [[id number]]  (Abnormal) Collected: 08/22/20 1527     Updated: 08/22/20 1543     Whole Blood Glucose POCT 303 mg/dL     Hemoglobin G2X [[id number]]  (Abnormal) Collected: 08/22/20 0609    Specimen: Blood Updated: 08/22/20 0945     Hemoglobin A1C 8.7 %      Average Estimated Glucose 203.0 mg/dL     Glucose Whole Blood - POCT [[id number]]  (Abnormal) Collected: 08/22/20 0749     Updated: 08/22/20 0818     Whole Blood Glucose POCT 224 mg/dL     Basic Metabolic Panel [[id number]]  (Abnormal) Collected: 08/22/20 0609    Specimen: Blood Updated: 08/22/20 0749     Glucose 217 mg/dL      BUN 13 mg/dL      Creatinine 0.8 mg/dL      Calcium 9.0 mg/dL      Sodium 595 mEq/L      Potassium 3.8 mEq/L      Chloride 104 mEq/L      CO2 22 mEq/L      Anion Gap 10.0    Magnesium [[id number]] Collected: 08/22/20 0609    Specimen: Blood Updated: 08/22/20 0749     Magnesium 1.9 mg/dL     GFR [[id number]] Collected: 08/22/20 0609     Updated: 08/22/20 0749     EGFR >60.0       CBC without differential [[id number]] Collected: 08/22/20 0609    Specimen: Blood Updated: 08/22/20 0722     WBC 5.08 x10 3/uL      Hgb 13.1 g/dL      Hematocrit 09.3 %      Platelets 219 x10 3/uL      RBC 4.58 x10 6/uL      MCV 86.2 fL      MCH 28.6 pg      MCHC 33.2 g/dL      RDW 13 %      MPV 8.9 fL      Nucleated RBC 0.0 /100 WBC      Absolute NRBC 0.00 x10 3/uL     PT/APTT [[id number]]  (Abnormal) Collected: 08/22/20 0609     Updated: 08/22/20 0720     PT 15.6 sec      PT INR 1.3     PTT 27 sec     Glucose Whole Blood - POCT [[id number]]  (Abnormal) Collected: 08/21/20 2127     Updated: 08/21/20 2143     Whole Blood Glucose POCT 342 mg/dL     Sedimentation rate (ESR)  [[id number]]  (Abnormal) Collected: 08/21/20 1736    Specimen: Blood Updated: 08/21/20 1802     Sed Rate 38 mm/Hr     Comprehensive metabolic panel [[id number]]  (Abnormal) Collected: 08/21/20 1736    Specimen: Blood Updated: 08/21/20 1801     Glucose 301 mg/dL      BUN 21 mg/dL      Creatinine 1.1 mg/dL      Sodium 626 mEq/L      Potassium 4.0 mEq/L  Chloride 100 mEq/L      CO2 25 mEq/L      Calcium 9.9 mg/dL      Protein, Total 7.2 g/dL      Albumin 3.5 g/dL      AST (SGOT) 25 U/L      ALT 40 U/L      Alkaline Phosphatase 99 U/L      Bilirubin, Total 0.4 mg/dL      Globulin 3.7 g/dL      Albumin/Globulin Ratio 0.9     Anion Gap 11.0    GFR [[id number]] Collected: 08/21/20 1736     Updated: 08/21/20 1801     EGFR >60.0       Lactic Acid [[id number]] Collected: 08/21/20 1735    Specimen: Blood Updated: 08/21/20 1753     Lactic Acid 1.4 mmol/L     Narrative:      Cancel second order for specimen if the initial level is less  than 2.89mEq/L    CBC and differential [[id number]]  (Abnormal) Collected: 08/21/20 1736    Specimen: Blood Updated: 08/21/20 1746     WBC 5.20 x10 3/uL      Hgb 14.5 g/dL      Hematocrit 08.6 %      Platelets 236 x10 3/uL      RBC 4.92 x10 6/uL      MCV 86.0 fL      MCH 29.5 pg      MCHC 34.3 g/dL      RDW 12 %      MPV 8.7 fL      Neutrophils 64.9 %      Lymphocytes Automated 18.1 %      Monocytes 13.7 %      Eosinophils Automated 1.9 %      Basophils Automated 0.8 %      Immature Granulocytes 0.6 %      Nucleated RBC 0.0 /100 WBC      Neutrophils Absolute 3.38 x10 3/uL      Lymphocytes Absolute Automated 0.94 x10 3/uL      Monocytes Absolute Automated 0.71 x10 3/uL      Eosinophils Absolute Automated 0.10 x10 3/uL      Basophils Absolute Automated 0.04 x10 3/uL      Immature Granulocytes Absolute 0.03 x10 3/uL      Absolute NRBC 0.00 x10 3/uL           Last EKG Result       None          No results found for any visits on 08/21/20.    III. Assessment and Plan (Medical Decision Making)        Daniel Hebert is a 60 y.o. 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 Right Foot OM, DM Type II, HTN, HLD, Hx. Meningioma, GERD who presented with Right Foot Pain.     Patient reports for the past 4-5 days he has had progressive redness, pain, and swelling of his right foot. He has had chronic foot ulcer and recurrent infections in the past and has been evaluated by many different Podiatrists and Ortho Ankle/Foot physicians recently. He had MRI recently which showed chronic stable changes related to the foot and no acute OM or septic arthritis. The patient is determined not to undergo any amputations and has poor opinion of Podiatry as a field stating that they have mismanaged his condition. He was seen by Foot/Ankle Ortho Dr. Mosetta Putt  who recommended coming to the hospital for IV Abx and Podiatry evaluation. In the ED, hemodynamically stable, started on Vanc+Zosyn and admitted for further management. On my discussion with the patient, he was very resistant to having Podiatry consult, but on further discussion, has agreed to evaluation by them in the AM.    Patient reportedly drove from [place] to this area with his dog in the car left the dog in the car and heart son of couple days while he is in the hospital.  The dog is subsequently rescued and currently in the pond.  Police charges are pending     ZO:[id number] smoking tobacco, drinking alcohol, or using illicit drugs.  His ex-wife lives in his    A psychiatric consult was initiated as patient is refusing treatment angry agitated paranoid delusional agitated demanding argumentative and a capacity evaluation is recommended.  Patient is willing to take IV antibiotics  Does not want any p.o. medications for diabetes mellitus, hypertension, any other issues    Medical history is significant for hypertensive cardiovascular disorder, right lower extremity cellulitis, infection, osteomyelitis, diabetes mellitus,  hypercholesterolemia, history of brain tumor /meningioma, GERD, poor self-care  MRI of the foot indicates     IMPRESSION:  1.  Osseous erosion and fragmentation of the second toe distal phalanx  with abnormal marrow edema in the second toe middle and distal  phalanges, compatible with osteomyelitis.  2.  Degenerative changes of the midfoot with adjacent osseous erosions  and extensive marrow edema/enhancement. 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  3.  Diffuse subcutaneous edema and mild enhancement throughout the foot,  especially in the second toe, compatible with cellulitis. No discrete  abscess or drainable fluid collection is identified.     These urgent results were discussed with and acknowledged by Burnett Kanaris, DO, on 08/22/2020 at 2:50 PM.   Carla Drape, MD   08/22/2020 2:50 PM     Knives were discovered in his room and were removed by the hospital staff    Well-developed single Caucasian male  Rapid pressured speech anxious angry irritable  Severe thought disorder was noted  Patient denies suicidal homicidal ideations cooperative contract for safety  Patient is not a suicidal risk not a homicidal risk at this time  Patient denies hearing voices denies suicidal homicidal ideations denies any dangerousness at this time  Patient is willing to take IV antibiotics however patient is refusing p.o. medications for his hypertension or diabetes mellitus and other issues  Patient is argumentative disorganized delusional irritable grandiose not in distress not dangerous    08/31/2020  Patient is cooperative argumentative and not in distress  Patient denies suicidal homicidal ideations at this time  Patient is not a suicidal risk not a homicidal risk at this time  If uncooperative with the treatment recommendations patient may be considered for discharge with outpatient follow-up plans  Patient is scheduled for a surgical procedure  on 08/29/2020  Pathology report is pending  Discharge plans when medically stable  Patient is uncooperative for treatment recommendations diabetes mellitus and bipolar mood disorder      Diagnostic impression  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  Axis IV moderate  Axis V 20-25    Disposition Recommendation:     Medication management  Medical stabilization  Supportive care  Patient is cooperative and willing to take IV antibiotics at this time  However patient is refusing to take p.o. medication for hypertensive cardiovascular disorder or diabetes mellitus at this time, patient states that he does not believe in the p.o. medications and insulin  Discharge plans and case management services recommended when medically stable  If patient needs involuntary medication management may consider a medical TDO for involuntary treatment and medication management  Patient is not a suicidal risk not a homicidal risk at this time patient may be discharged when medically stable  Discharge plans and case management services when medically stable recommended  Patient has no knives in his room at this time patient is stable safe contract for safety  Case management services and discharge plans recommended      Signed by: Violeta Gelinas, MD  08/31/2020  12:41 PM    *This note was generated by the Epic EMR system/ Dragon speech recognition and may contain inherent errors or omissions not intended by the user. Grammatical errors, random word  insertions, deletions, pronoun errors and incomplete sentences are occasional consequences of this technology due to software limitations. Not all errors are caught or corrected. If there are questions or concerns about the content of this note or information contained within the body of this dictation they should be addressed directly with the author for clarification

## 2020-08-31 NOTE — Anesthesia Postprocedure Evaluation (Signed)
Anesthesia Post Evaluation    Patient: Daniel Hebert    Procedure(s):  AMPUTATION, TOE    Anesthesia type: general    Last Vitals:   Vitals Value Taken Time   BP 111/69 08/31/20 1733   Temp 36.5 C (97.7 F) 08/31/20 1733   Pulse 83 08/31/20 1733   Resp 16 08/31/20 1733   SpO2 93 % 08/31/20 1733                 Anesthesia Post Evaluation:     Patient Evaluated: PACU    Level of Consciousness: awake  Pain Score: 2  Pain Management: adequate    Airway Patency: patent    Anesthetic complications: No      PONV Status: none    Cardiovascular status: acceptable  Respiratory status: acceptable  Hydration status: acceptable        Signed by: Tillie Rung, MD, 08/31/2020 5:41 PM

## 2020-08-31 NOTE — Progress Notes (Signed)
POD#0  Right partial second toe amputation    -Keep right foot dressing dry, clean, and intact. Do not remove or get wet.  -Ice, elevate, offload right foot at rest.  -No dressing changes needed until office visit.   -Continue antibiotics per ID and can be discharged based of their recommendations. A new pre lavage deep wound culture was obtained from the right second toe and partial amputated toe was sent to pathology.   -WBAT RLE in surgical shoe.  -Dr. Elodia Florence was updated after surgery about podiatry plans. Patient will follow up in the office 09/05/20 or 09/10/20 depending on when he is discharged.

## 2020-08-31 NOTE — Progress Notes (Signed)
[facility] Navigator Note    Pt had called [facility] for 3rd opinion from Dr Hospital doctor. Dr driver does not see inpatients at [facility] at this time. Navigator called pt to F/U and let him know. Pt stated based on path results he had agreed to amp of toe to try for a cure.   Emotional support given and pt understands if incision doesn't heal he can access the [facility] here or in [place]   Pt has navigator and [facility] contact information         Yisroel Ramming RN, BSN  [facility] Navigator   [email or web address]  [phone]

## 2020-09-01 LAB — GLUCOSE WHOLE BLOOD - POCT
Whole Blood Glucose POCT: 278 mg/dL — ABNORMAL HIGH (ref 70–100)
Whole Blood Glucose POCT: 371 mg/dL — ABNORMAL HIGH (ref 70–100)
Whole Blood Glucose POCT: 296 mg/dL — ABNORMAL HIGH (ref 70–100)
Whole Blood Glucose POCT: 386 mg/dL — ABNORMAL HIGH (ref 70–100)

## 2020-09-01 LAB — BASIC METABOLIC PANEL
Anion Gap: 16 — ABNORMAL HIGH (ref 5.0–15.0)
BUN: 14 mg/dL (ref 9–28)
CO2: 19 mEq/L — ABNORMAL LOW (ref 22–29)
Calcium: 9.8 mg/dL (ref 8.5–10.5)
Chloride: 98 mEq/L — ABNORMAL LOW (ref 100–111)
Creatinine: 1.2 mg/dL (ref 0.7–1.3)
Glucose: 397 mg/dL — ABNORMAL HIGH (ref 70–100)
Potassium: 4.4 mEq/L (ref 3.5–5.1)
Sodium: 133 mEq/L — ABNORMAL LOW (ref 136–145)

## 2020-09-01 LAB — GFR: EGFR: >60

## 2020-09-01 MED ORDER — KETOROLAC TROMETHAMINE 30 MG/ML IJ SOLN
15.0000 mg | Freq: Once | INTRAMUSCULAR | Status: AC
Start: 2020-09-01 — End: 2020-09-01
  Administered 2020-09-01: 15 mg via INTRAVENOUS
  Filled 2020-09-01: qty 1

## 2020-09-01 NOTE — Plan of Care (Signed)
Pt is AxOx4, on RA, VSS. On ACHS, has elevated blood glucose but is refusing inuslin. R foot pain managed with pain meds. On IV anbx. Pt ambulates to the BR with standby assist. Denies any n/v/ SOB. Call bell placed within reach. Bed placed in lowest position. Will continue to monitor.    Problem: Safety  Goal: Patient will be free from injury during hospitalization  Outcome: Progressing  Flowsheets (Taken 08/31/2020 0227 by Cherie Ouch, RN)  Patient will be free from injury during hospitalization:   Assess patient's risk for falls and implement fall prevention plan of care per policy   Provide and maintain safe environment   Ensure appropriate safety devices are available at the bedside   Include patient/ family/ care giver in decisions related to safety  Goal: Patient will be free from infection during hospitalization  Outcome: Progressing  Flowsheets (Taken 08/21/2020 2354 by Gabriela Eves, RN)  Free from Infection during hospitalization:   Assess and monitor for signs and symptoms of infection   Monitor lab/diagnostic results   Monitor all insertion sites (i.e. indwelling lines, tubes, urinary catheters, and drains)     Problem: Pain  Goal: Pain at adequate level as identified by patient  Outcome: Progressing  Flowsheets (Taken 08/29/2020 2353 by Cherie Ouch, RN)  Pain at adequate level as identified by patient:   Identify patient comfort function goal   Assess pain on admission, during daily assessment and/or before any "as needed" intervention(s)   Evaluate if patient comfort function goal is met   Evaluate patient's satisfaction with pain management progress   Offer non-pharmacological pain management interventions   Include patient/patient care companion in decisions related to pain management as needed     Problem: Compromised Tissue integrity  Goal: Damaged tissue is healing and protected  Outcome: Progressing  Flowsheets (Taken 08/29/2020 2353 by Cherie Ouch, RN)  Damaged tissue is  healing and protected:   Monitor patient's hygiene practices   Monitor external devices/tubes for correct placement to prevent pressure, friction and shearing   Keep intact skin clean and dry   Increase activity as tolerated/progressive mobility  Goal: Nutritional status is improving  Outcome: Progressing  Flowsheets (Taken 08/22/2020 1020 by Octaviano Batty, RN)  Nutritional status is improving:   Allow adequate time for meals   Encourage patient to take dietary supplement(s) as ordered   Include patient/patient care companion in decisions related to nutrition   Collaborate with Clinical Nutritionist     Problem: Diabetes: Glucose Imbalance  Goal: Blood glucose stable at established goal  Outcome: Progressing  Flowsheets (Taken 08/29/2020 0038 by Cherie Ouch, RN)  Blood glucose stable at established goal:   Monitor lab values   Include patient/family in decisions related to nutrition/dietary selections   Monitor/assess vital signs   Assess for hypoglycemia /hyperglycemia   Ensure adequate hydration   Ensure patient/family has adequate teaching materials

## 2020-09-01 NOTE — OT Progress Note (Signed)
Occupational Therapy Note    Orders received, chart reviewed, RN consulted and cleared for OT screen. OT discussed patient with PT, who completed evaluation earlier in the day. Patient is independent with all aspects of mobility without AD; per RN and patient report, he has been completing bathroom ADLs and bed mobility independently as well. Patient denies any skilled OT needs at this time, states he is independent and can manage his care at discharge. No equipment needs identified at this time either. Will discontinue OT order.    Marybelle Killings, OTR/L, OTD

## 2020-09-01 NOTE — Progress Notes (Signed)
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  Faecalis, Klebsiella & E. coli.  Intra-Op cultures pending  MRSA screen negative  Continue IV Zosyn,ID follow up  Discharge antibiotics will be based on intra-op cultures,anticipate results by 09/03/2020  Local wound care/dressing changes per podiatry recs  WBAT Right LE in surgical shoe    Uncontrolled DM: Continue SSI,Lantus    HTN:  Continue amlodipine    History of meningioma: Outpatient follow-up    Hyponatremia,in the setting of hyperglycemia : Monitor BMP    Dispo: Likely home on 09/03/2020(with either IV or oral abx based on wound culture results)    Patient Lines/Drains/Airways Status       Active PICC Line / CVC Line / PIV Line / Drain / Airway / Intraosseous Line / Epidural Line / ART Line / Line / Wound / Pressure Ulcer / NG/OG Tube       Name Placement date Placement time Site Days    Peripheral IV 08/21/20 20 G Standard Left Antecubital 08/21/20  1730  Antecubital  1                        and examined today at bedside . no acute complaints    MEDICATIONS     Current Facility-Administered Medications   Medication Dose Route Frequency    amLODIPine  10 mg Oral Daily    cadexomer iodine   Topical Daily    carboxymethylcellulose (PF)  1 drop Both Eyes QID    docusate sodium  100 mg Oral Daily    heparin (porcine)  5,000 Units Subcutaneous Q12H SCH    insulin glargine  20 Units Subcutaneous QHS    insulin lispro  1-3 Units Subcutaneous QHS    insulin lispro  1-5 Units Subcutaneous TID AC    insulin lispro  3 Units Subcutaneous Once    piperacillin-tazobactam  4.5 g Intravenous Q8H    polyethylene glycol  17 g Oral Daily        ROS       Neurological: No headache or dizziness  Respiratory: No cough or shortness of breath  Cardiovascular: No chest pain      PHYSICAL EXAM     Vitals:    09/01/20 1534   BP: (!) 169/109   Pulse: (!) 104   Resp: 17   Temp:    SpO2: 98%       Temperature: Temp  Min: 97.7 F (36.5 C)  Max: 98.4 F (36.9 C)  Pulse: Pulse  Min: 83  Max: 104  Respiratory: Resp  Min: 15  Max: 18  Non-Invasive BP: BP  Min: 143/85  Max: 169/109  Pulse Oximetry SpO2  Min: 95 %  Max: 98 %    Intake and Output Summary (Last 24 hours) at Date Time    Intake/Output Summary (Last 24 hours) at 09/01/2020 1856  Last data filed at 09/01/2020 0530  Gross per 24 hour   Intake 800 ml   Output --   Net 800 ml  GEN APPEARANCE: Alert and oriented x 3  CVS: RRR,  LUNGS: CTAB; No Wheezes; No Rhonchi: No rales  Vascular: Dorsalis pedis pulses are palpable  ABD: Soft; No TTP  EXT: Right foot dressing intact  NEURO: Nonfocal.  Psych: Anxious,tendency towards tangential conversations    LABS     Recent Labs   Lab 08/28/20  0547 08/27/20  0524 08/26/20  0709   WBC 6.42 6.28 6.22   RBC 5.04 5.01 5.14   Hgb 14.9 14.8 15.5   Hematocrit 43.6 43.1 44.0   MCV 86.5 86.0 85.6   Platelets 333 300 312         Recent Labs   Lab 08/30/20  0614 08/28/20  1003 08/28/20  0547 08/27/20  0524 08/26/20  0709   Sodium 134*  --  133* 134* 133*   Potassium 4.7 4.7 5.7* 5.0 4.9   Chloride 100  --  101 102 104   CO2 24  --  24 23 20*   BUN 14  --  13 15 12    Creatinine 1.2  --  1.2 1.1 0.9   Glucose 268*  --  277* 282* 270*   Calcium 10.1  --  9.9 9.4 9.8   Magnesium  --   --  2.0 1.8 1.9                               Microbiology Results (last 15 days)       Procedure Component Value Units Date/Time    Culture, Anaerobic Bacteria [[id number]] Collected: 08/31/20 1700    Order Status: Sent Specimen: Other from Wound Updated: 08/31/20 2041    Fungal Culture & Smear [[id number]] Collected: 08/31/20 1700    Order Status: Completed Specimen: Other from Tissue Updated:  09/01/20 1616    Narrative:      ORDER#: [id number]                                    ORDERED BY: Allena Katz, SHRUTI  SOURCE: Tissue right foot - second toe               COLLECTED:  08/31/20 17:00  ANTIBIOTICS AT COLL.:                                RECEIVED :  08/31/20 20:41  Stain, Fungal                              FINAL       09/01/20 16:16  09/01/20   No Fungal or Yeast Elements Seen  Culture Fungus                             PENDING      Culture + Gram Azzie Glatter Wound [[id number]] Collected: 08/31/20 1700    Order Status: Completed Specimen: Wound from Incision Updated: 09/01/20 1808    Narrative:      ORDER#: [id number]                                    ORDERED BY: Junius Roads  SOURCE: Incision right foot - second toe  COLLECTED:  08/31/20 17:00  ANTIBIOTICS AT COLL.:                                RECEIVED :  08/31/20 20:41  Stain, Gram                                FINAL       08/31/20 22:11  08/31/20   No WBCs seen             No Squamous epithelial cells seen             No organisms seen  Culture and Gram Stain, Aerobic, Wound     PRELIM      09/01/20 18:08  09/01/20   Culture no growth to date, Final report to follow      Culture + Gram Azzie Glatter Wound [[id number]] Collected: 08/29/20 1816    Order Status: Completed Specimen: Wound from Bone Updated: 09/01/20 1757    Narrative:      ORDER#: [id number]                                    ORDERED BY: PATEL, SHRUTI  SOURCE: Bone Right second intermediate phala         COLLECTED:  08/29/20 18:16  ANTIBIOTICS AT COLL.:                                RECEIVED :  08/30/20 01:11  Stain, Gram                                FINAL       08/30/20 05:18  08/30/20   No WBCs or organisms seen             No Squamous epithelial cells seen  Culture and Gram Stain, Aerobic, Wound     PRELIM      09/01/20 17:57   +  08/31/20   Culture no growth to date, Final report to follow  09/01/20   From liquid media only Enterobacter cloacae complex                Refer to susceptibilities on culture #[id number]        Culture, Anaerobic Bacteria [[id number]] Collected: 08/29/20 1816    Order Status: Sent Specimen: Other from Bone Updated: 08/30/20 0112    Culture + Gram Stain,Aerobic, Wound [[id number]] Collected: 08/29/20 1810    Order Status: Completed Specimen: Wound from Bone Updated: 09/01/20 1121    Narrative:      ORDER#: [id number]                                    ORDERED BY: PATEL, SHRUTI  SOURCE: Bone Right second distal phalanx bon         COLLECTED:  08/29/20 18:10  ANTIBIOTICS AT COLL.:                                RECEIVED :  08/30/20 01:11  Stain, Gram  FINAL       08/30/20 05:17  08/30/20   No WBCs or organisms seen             No Squamous epithelial cells seen  Culture and Gram Stain, Aerobic, Wound     PRELIM      09/01/20 11:21   +  09/01/20   Very light growth of Enterobacter cloacae complex               Further workup to follow including susceptibility testing    09/01/20   Very light growth of Enterococcus faecalis               Further workup to follow including susceptibility testing        Culture, Anaerobic Bacteria [[id number]] Collected: 08/29/20 1810    Order Status: Sent Specimen: Other from Bone Updated: 08/30/20 0112    Culture, Anaerobic Bacteria [[id number]] Collected: 08/29/20 1805    Order Status: Sent Specimen: Other from Ulcer Updated: 08/30/20 0057    Culture + Gram Stain,Aerobic, Wound [[id number]] Collected: 08/29/20 1805    Order Status: Completed Specimen: Wound from Ulcer Updated: 09/01/20 1203    Narrative:      ORDER#: [id number]                                    ORDERED BY: Allena Katz, SHRUTI  SOURCE: Ulcer Right second toe culture               COLLECTED:  08/29/20 18:05  ANTIBIOTICS AT COLL.:                                RECEIVED :  08/30/20 00:57  Stain, Gram                                FINAL       08/30/20 03:26  08/30/20   Few WBCs             No organisms seen             No Squamous epithelial  cells seen  Culture and Gram Stain, Aerobic, Wound     PRELIM      09/01/20 12:02   +  09/01/20   Very light growth of Enterobacter cloacae complex               Further workup to follow including susceptibility testing             Enterobacter cloacae, Klebsiella aerogenes, and Citrobacter             freundii may develop B-lactam resistance while on therapy with             B-lactam antibiotics. Even if initially susceptible, resistance             may develop due to induction of AmpC expression by B-lactams.             Exceptions to this include cefepime and carbapenems.             [facility] Antimicrobial Subcommittee March 2022    _____________________________________________________________________________  E. cloacae compl  ANTIBIOTICS                     MIC  INTRP      _____________________________________________________________________________  Amoxicillin/CA                 >16/8   R        Ampicillin                      >16    R        Ampicillin/sulbactam           >16/8   R        Aztreonam                       <=2    S        Cefazolin                       >16    R        Cefepime                        <=1    S        Cefoxitin                       >16    R        Ceftazidime                     <=2    S        Ceftriaxone                     <=1    S        Ciprofloxacin                 <=0.25   S        Ertapenem                     <=0.25   S        Gentamicin                      <=2    S        Levofloxacin                   <=0.5   S        Meropenem                      <=0.5   S        Piperacillin/Tazobactam         4/4    S        Tetracycline                    <=2    S        Trimethoprim/Sulfamethoxazole <=0.5/9  S        _____________________________________________________________________________            S=SUSCEPTIBLE     I=INTERMEDIATE     R=RESISTANT                             N/S=NON-SUSCEPTIBLE  _____________________________________________________________________________      Microbiology additional request [[id number]] Collected: 08/25/20 0952    Order Status: Completed Specimen: Blood from Wound Updated: 08/25/20 0957    Narrative:      [id number] - Ecoli sensitivities, thanks.  ORDER#: [id number]                                    ORDERED BY: Rayetta Humphrey, TREVOR  SOURCE: Wound wound                                  COLLECTED:  08/25/20 09:52  ANTIBIOTICS AT COLL.:                                RECEIVED :  08/25/20 09:52  Microbiology Add On Request                FINAL       08/25/20 09:57  08/25/20   For results, see #[id number]      Culture + Gram Azzie Glatter Wound [[id number]] Collected: 08/23/20 1724    Order Status: Completed Specimen: Wound from Abrasion Updated: 08/27/20 1553    Narrative:      ORDER#: [id number]                                    ORDERED BY: Elise Benne  SOURCE: Abrasion R 2nd toe                           COLLECTED:  08/23/20 17:24  ANTIBIOTICS AT COLL.:                                RECEIVED :  08/23/20 22:27  Stain, Gram                                FINAL       08/24/20 00:32   +  08/24/20   Few WBCs             No Squamous epithelial cells seen             Many Gram positive cocci  Culture and Gram Stain, Aerobic, Wound     FINAL       08/27/20 15:53   +  08/24/20   Moderate growth of mixed cutaneous flora  08/27/20   Light growth of Escherichia coli      08/26/20   Moderate growth of Enterococcus faecalis      08/27/20   Very light growth of Klebsiella pneumoniae      _____________________________________________________________________________                                     E.coli        E.faecalis     K.pneumoniae    ANTIBIOTICS                     MIC  INTRP      MIC  INTRP  MIC  INTRP      _____________________________________________________________________________  Amoxicillin/CA                 <=4/2   S                       <=4/2   S         Ampicillin                      <=4    S         2     S        >16    R        Ampicillin/sulbactam            4/2    S                        8/4    S        Aztreonam                       <=2    S                        <=2    S        Cefazolin                        2     S                         2     S        Cefepime                        <=1    S                        <=1    S        Cefoxitin                       <=4    S                        <=4    S        Ceftazidime                     <=2    S                        <=2    S        Ceftriaxone                     <=1    S                        <=1    S        Cefuroxime                      <=4    S                        <=  4    S        Ciprofloxacin                 <=0.25   S                      <=0.25   S        Ertapenem                     <=0.25   S                      <=0.25   S        Gentamicin                      <=2    S                        <=2    S        Gentamicin High Level Resistan                 <=500   S                        Levofloxacin                   <=0.5   S                       <=0.5   S        Meropenem                      <=0.5   S                       <=0.5   S        Penicillin                                       4     S                        Piperacillin/Tazobactam        <=2/4   S                        4/4    S        Streptomycin High Level Resist                <=1000   S                        Tetracycline                    <=2    S                        <=2    S        Trimethoprim/Sulfamethoxazole <=0.5/9  S                      <=0.5/9  S  Vancomycin                                       1     S                        _____________________________________________________________________________            S=SUSCEPTIBLE     I=INTERMEDIATE     R=RESISTANT                             N/S=NON-SUSCEPTIBLE  _____________________________________________________________________________      Additional Culture Request [[id number]] Collected: 08/23/20 1724    Order Status: Completed Specimen: Abrasion Updated: 08/25/20 0956    Narrative:      ORDER#: [id number]                                    ORDERED BY: Elise Benne  SOURCE: Abrasion R 2nd toe                           COLLECTED:  08/23/20 17:24  ANTIBIOTICS AT COLL.:                                RECEIVED :  08/23/20 22:27  Additional Culture Request                 FINAL       08/25/20 09:56  08/25/20   E. coli susceptibility requested by physician:             Dr. Fredia Beets on Order [phone]      MRSA culture [[id number]] Collected: 08/22/20 2133    Order Status: Completed Specimen: Culturette from Nasal Swab Updated: 08/23/20 2038     Culture MRSA Surveillance Negative for Methicillin Resistant Staph aureus    MRSA culture [[id number]] Collected: 08/22/20 2133    Order Status: Completed Specimen: Culturette from Throat Updated: 08/23/20 2038     Culture MRSA Surveillance Negative for Methicillin Resistant Staph aureus    Culture Blood Aerobic and Anaerobic [[id number]] Collected: 08/21/20 1809    Order Status: Completed Specimen: Arm from Blood, Venipuncture Updated: 08/27/20 0321    Narrative:      ORDER#: [id number]                                    ORDERED BY: Kelby Aline  SOURCE: Blood, Venipuncture Arm                      COLLECTED:  08/21/20 18:09  ANTIBIOTICS AT COLL.:                                RECEIVED :  08/22/20 00:57  Culture Blood Aerobic and Anaerobic        FINAL       08/27/20 03:21  08/27/20   No growth after 5 days of incubation.      Culture Blood Aerobic and Anaerobic [[id number]] Collected: 08/21/20 1736    Order Status: Completed  Specimen: Arm from Blood, Venipuncture Updated: 08/27/20 0321    Narrative:      ORDER#: [id number]                                    ORDERED BY: Kelby Aline  SOURCE: Blood, Venipuncture  Arm                      COLLECTED:  08/21/20 17:36  ANTIBIOTICS AT COLL.:                                RECEIVED :  08/22/20 00:57  Culture Blood Aerobic and Anaerobic        FINAL       08/27/20 03:21  08/27/20   No growth after 5 days of incubation.               RADIOLOGY     Radiological Procedure personally reviewed and concur with radiologist reports unless stated otherwise.    XR Foot Right AP And Lateral   Final Result      1. Status post dilatation of the right second toe to the level of the   PIP joint.   2. Mild lateral subluxation of the second metatarsal base suggesting   Lisfranc injury.   3. Mild degenerative arthritis in the right midfoot.      Lanier Ensign, MD    08/31/2020 7:01 PM      US Arterial / Graft Duplex Doppler Lower Extremity Right   Final Result       1. Right lower extremity: No arterial insufficiency of the right lower   extremity at rest with triphasic distal tibial waveforms and normal   waveforms of the right second toe.   2. Left lower extremity: No arterial insufficiency of the left lower   extremity at rest.      Adelina Mings, MD    08/24/2020 10:05 AM      [place] Noninvas Low Extrem Art Dopp/Press/Wavefrms (PVR) Comp 3-4 Levels   Final Result       1. Right lower extremity: No arterial insufficiency of the right lower   extremity at rest with triphasic distal tibial waveforms and normal   waveforms of the right second toe.   2. Left lower extremity: No arterial insufficiency of the left lower   extremity at rest.      Adelina Mings, MD    08/24/2020 10:05 AM      MRI Foot Right W WO Contrast   Final Result      1.  Osseous erosion and fragmentation of the second toe distal phalanx   with abnormal marrow edema in the second toe middle and distal   phalanges, compatible with osteomyelitis.      2.  Degenerative changes of the midfoot with adjacent osseous erosions   and extensive marrow edema/enhancement. These findings are likely due to   Charcot arthropathy, although superimposed  infection is in region is   difficult to exclude.      3.  Diffuse subcutaneous edema and mild enhancement throughout the foot,   especially in the second toe, compatible with cellulitis. No discrete   abscess or drainable fluid collection is identified.      These urgent results were discussed with and acknowledged by Burnett Kanaris, DO, on 08/22/2020  at 2:50 PM.      Carla Drape, MD    08/22/2020 2:50 PM      Foot Right AP Lateral And Oblique   Final Result         1.  Abnormal lucency, erosion, and fragmentation of the second toe   distal phalanx, most likely representing osteomyelitis with superimposed   pathologic fracture. There is pronounced soft tissue swelling of the   second toe and dorsal foot.      2.  Abnormal lateral subluxation of the second TMT joint and, to a   slightly lesser extent, third and fourth TMT joints, concerning for   Lisfranc ligamentous injury. There are associated degenerative changes   and ossification along the dorsal midfoot, which may be reflective of   developing Charcot arthropathy.      3.  NOTIFICATION: The critical findings of this study were discussed   with, and acknowledged by, Dr. Kelby Aline via telephone at 6:53 PM on   08/21/2020.      Vassie Moment, MD    08/21/2020 6:54 PM          Signed,  Katy Apo, MD  6:56 PM 09/01/2020

## 2020-09-01 NOTE — Plan of Care (Signed)
Pt slept throughout the night. Pt A&Ox4, denies pain, SOB, and/or any acute distress. Pt is reporting watery and irritated eyes from the "black mold" in his room coming from dirty filter in air vent. Engineering called to pt room to speak with pt. VSS, call bell within reach w/ use reinforced, and safety precautions in place.   Problem: Safety  Goal: Patient will be free from injury during hospitalization  Outcome: Progressing  Goal: Patient will be free from infection during hospitalization  Outcome: Progressing     Problem: Pain  Goal: Pain at adequate level as identified by patient  Outcome: Progressing     Problem: Side Effects from Pain Analgesia  Goal: Patient will experience minimal side effects of analgesic therapy  Outcome: Progressing     Problem: Discharge Barriers  Goal: Patient will be discharged home or other facility with appropriate resources  Outcome: Progressing     Problem: Psychosocial and Spiritual Needs  Goal: Demonstrates ability to cope with hospitalization/illness  Outcome: Progressing     Problem: Compromised Tissue integrity  Goal: Damaged tissue is healing and protected  Outcome: Progressing  Goal: Nutritional status is improving  Outcome: Progressing     Problem: Diabetes: Glucose Imbalance  Goal: Blood glucose stable at established goal  Outcome: Progressing     Problem: Moderate/High Fall Risk Score >5  Goal: Patient will remain free of falls  Outcome: Progressing
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
## 2020-09-01 NOTE — Addendum Note (Signed)
Addendum  created 09/01/20 0958 by Thomasenia Sales, CRNA    Clinical Note Signed

## 2020-09-01 NOTE — Progress Notes (Signed)
No apparent complications following anesthesia

## 2020-09-02 LAB — GLUCOSE WHOLE BLOOD - POCT
Whole Blood Glucose POCT: 284 mg/dL — ABNORMAL HIGH (ref 70–100)
Whole Blood Glucose POCT: 285 mg/dL — ABNORMAL HIGH (ref 70–100)
Whole Blood Glucose POCT: 327 mg/dL — ABNORMAL HIGH (ref 70–100)

## 2020-09-02 MED ORDER — KETOROLAC TROMETHAMINE 30 MG/ML IJ SOLN
15.0000 mg | Freq: Four times a day (QID) | INTRAMUSCULAR | Status: DC | PRN
Start: 2020-09-02 — End: 2020-09-03
  Administered 2020-09-02: 15 mg via INTRAVENOUS
  Filled 2020-09-02: qty 1

## 2020-09-02 NOTE — Progress Notes (Signed)
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  Faecalis, Klebsiella & E. coli.  Intra-Op cultures pending  MRSA screen negative  Continue IV Zosyn,ID follow up  Discharge antibiotics will be based on intra-op cultures,anticipate results by 09/03/2020  Local wound care/dressing changes per podiatry recs  WBAT Right LE in surgical shoe    Uncontrolled DM: Continue SSI,Lantus which patient has refused.Patient stated that he has been on a 'herbal regimen' to manage his diabetes and will never take insulin.    HTN:  Continue amlodipine    History of meningioma: Outpatient follow-up    Hyponatremia,in the setting of hyperglycemia : Monitor BMP    Dispo: Likely home on 09/03/2020(with either IV or oral abx based on wound culture results)    Patient Lines/Drains/Airways Status       Active PICC Line / CVC Line / PIV Line / Drain / Airway / Intraosseous Line / Epidural Line / ART Line / Line / Wound / Pressure Ulcer / NG/OG Tube       Name Placement date Placement time Site Days    Peripheral IV 08/21/20 20 G Standard Left Antecubital 08/21/20  1730  Antecubital  1                        and examined today at bedside . no acute complaints    MEDICATIONS     Current Facility-Administered Medications   Medication Dose Route Frequency    amLODIPine  10 mg Oral Daily    cadexomer iodine   Topical Daily    carboxymethylcellulose (PF)  1 drop Both Eyes QID    docusate sodium  100 mg Oral Daily    heparin (porcine)  5,000 Units Subcutaneous Q12H SCH    insulin glargine  20 Units Subcutaneous QHS    insulin lispro  1-3 Units Subcutaneous QHS    insulin lispro  1-5 Units Subcutaneous TID AC     insulin lispro  3 Units Subcutaneous Once    piperacillin-tazobactam  4.5 g Intravenous Q8H    polyethylene glycol  17 g Oral Daily       ROS     C/o 'shooting phantom pain' over his amputated toe,otherwise NAD.Conversations are tangential.      PHYSICAL EXAM     Vitals:    09/02/20 0832   BP: 150/79   Pulse: 79   Resp:    Temp:    SpO2:        Temperature: Temp  Min: 97.7 F (36.5 C)  Max: 98.4 F (36.9 C)  Pulse: Pulse  Min: 72  Max: 104  Respiratory: Resp  Min: 16  Max: 18  Non-Invasive BP: BP  Min: 138/79  Max: 200/98  Pulse Oximetry SpO2  Min: 95 %  Max: 98 %    Intake and Output Summary (Last 24 hours) at Date Time    Intake/Output Summary (Last 24 hours) at 09/02/2020 1102  Last data filed at 09/02/2020 0530  Gross per 24 hour   Intake 700 ml   Output --  Net 700 ml       GEN APPEARANCE: Alert and oriented x 3  CVS: RRR,  LUNGS: CTAB; No Wheezes; No Rhonchi: No rales  Vascular: Dorsalis pedis pulses are palpable  ABD: Soft; No TTP  EXT: Right foot dressing intact  NEURO: Nonfocal.  Psych: Anxious,tendency towards tangential conversations    LABS     Recent Labs   Lab 08/28/20  0547 08/27/20  0524   WBC 6.42 6.28   RBC 5.04 5.01   Hgb 14.9 14.8   Hematocrit 43.6 43.1   MCV 86.5 86.0   Platelets 333 300         Recent Labs   Lab 09/01/20  2043 08/30/20  0614 08/28/20  1003 08/28/20  0547 08/27/20  0524   Sodium 133* 134*  --  133* 134*   Potassium 4.4 4.7 4.7 5.7* 5.0   Chloride 98* 100  --  101 102   CO2 19* 24  --  24 23   BUN 14 14  --  13 15   Creatinine 1.2 1.2  --  1.2 1.1   Glucose 397* 268*  --  277* 282*   Calcium 9.8 10.1  --  9.9 9.4   Magnesium  --   --   --  2.0 1.8                               Microbiology Results (last 15 days)       Procedure Component Value Units Date/Time    Culture, Anaerobic Bacteria [[id number]] Collected: 08/31/20 1700    Order Status: Sent Specimen: Other from Wound Updated: 08/31/20 2041    Fungal Culture & Smear [[id number]] Collected: 08/31/20 1700    Order Status:  Completed Specimen: Other from Tissue Updated: 09/01/20 1616    Narrative:      ORDER#: [id number]                                    ORDERED BY: Allena Katz, SHRUTI  SOURCE: Tissue right foot - second toe               COLLECTED:  08/31/20 17:00  ANTIBIOTICS AT COLL.:                                RECEIVED :  08/31/20 20:41  Stain, Fungal                              FINAL       09/01/20 16:16  09/01/20   No Fungal or Yeast Elements Seen  Culture Fungus                             PENDING      Culture + Gram Azzie Glatter Wound [[id number]] Collected: 08/31/20 1700    Order Status: Completed Specimen: Wound from Incision Updated: 09/01/20 1808    Narrative:      ORDER#: [id number]                                    ORDERED BY: Junius Roads  SOURCE: Incision right foot -  second toe             COLLECTED:  08/31/20 17:00  ANTIBIOTICS AT COLL.:                                RECEIVED :  08/31/20 20:41  Stain, Gram                                FINAL       08/31/20 22:11  08/31/20   No WBCs seen             No Squamous epithelial cells seen             No organisms seen  Culture and Gram Stain, Aerobic, Wound     PRELIM      09/01/20 18:08  09/01/20   Culture no growth to date, Final report to follow      Culture + Gram Azzie Glatter Wound [[id number]] Collected: 08/29/20 1816    Order Status: Completed Specimen: Wound from Bone Updated: 09/01/20 1757    Narrative:      ORDER#: [id number]                                    ORDERED BY: Allena Katz, SHRUTI  SOURCE: Bone Right second intermediate phala         COLLECTED:  08/29/20 18:16  ANTIBIOTICS AT COLL.:                                RECEIVED :  08/30/20 01:11  Stain, Gram                                FINAL       08/30/20 05:18  08/30/20   No WBCs or organisms seen             No Squamous epithelial cells seen  Culture and Gram Stain, Aerobic, Wound     PRELIM      09/01/20 17:57   +  08/31/20   Culture no growth to date, Final report to follow  09/01/20   From liquid media  only Enterobacter cloacae complex               Refer to susceptibilities on culture #[id number]        Culture, Anaerobic Bacteria [[id number]] Collected: 08/29/20 1816    Order Status: Completed Specimen: Other from Bone Updated: 09/02/20 0227    Narrative:      ORDER#: [id number]                                    ORDERED BY: Junius Roads  SOURCE: Bone Right second intermediate phala         COLLECTED:  08/29/20 18:16  ANTIBIOTICS AT COLL.:                                RECEIVED :  08/30/20 01:11  Culture, Anaerobic Bacteria                PRELIM  09/02/20 02:27  09/02/20   Culture report to follow      Culture + Gram Azzie Glatter Wound [[id number]] Collected: 08/29/20 1810    Order Status: Completed Specimen: Wound from Bone Updated: 09/01/20 1121    Narrative:      ORDER#: [id number]                                    ORDERED BY: PATEL, SHRUTI  SOURCE: Bone Right second distal phalanx bon         COLLECTED:  08/29/20 18:10  ANTIBIOTICS AT COLL.:                                RECEIVED :  08/30/20 01:11  Stain, Gram                                FINAL       08/30/20 05:17  08/30/20   No WBCs or organisms seen             No Squamous epithelial cells seen  Culture and Gram Stain, Aerobic, Wound     PRELIM      09/01/20 11:21   +  09/01/20   Very light growth of Enterobacter cloacae complex               Further workup to follow including susceptibility testing    09/01/20   Very light growth of Enterococcus faecalis               Further workup to follow including susceptibility testing        Culture, Anaerobic Bacteria [[id number]] Collected: 08/29/20 1810    Order Status: Completed Specimen: Other from Bone Updated: 09/02/20 0227    Narrative:      ORDER#: [id number]                                    ORDERED BY: Allena Katz, SHRUTI  SOURCE: Bone Right second distal phalanx bon         COLLECTED:  08/29/20 18:10  ANTIBIOTICS AT COLL.:                                RECEIVED :  08/30/20 01:11  Culture, Anaerobic  Bacteria                PRELIM      09/02/20 02:27  09/02/20   Culture report to follow      Culture, Anaerobic Bacteria [[id number]] Collected: 08/29/20 1805    Order Status: Completed Specimen: Other from Ulcer Updated: 09/02/20 0226    Narrative:      ORDER#: [id number]                                    ORDERED BY: Allena Katz, SHRUTI  SOURCE: Ulcer Right second toe culture               COLLECTED:  08/29/20 18:05  ANTIBIOTICS AT COLL.:  RECEIVED :  08/30/20 00:57  Culture, Anaerobic Bacteria                PRELIM      09/02/20 02:26  09/02/20   Culture report to follow      Culture + Gram Azzie Glatter Wound [[id number]] Collected: 08/29/20 1805    Order Status: Completed Specimen: Wound from Ulcer Updated: 09/01/20 1203    Narrative:      ORDER#: [id number]                                    ORDERED BY: Allena Katz, SHRUTI  SOURCE: Ulcer Right second toe culture               COLLECTED:  08/29/20 18:05  ANTIBIOTICS AT COLL.:                                RECEIVED :  08/30/20 00:57  Stain, Gram                                FINAL       08/30/20 03:26  08/30/20   Few WBCs             No organisms seen             No Squamous epithelial cells seen  Culture and Gram Stain, Aerobic, Wound     PRELIM      09/01/20 12:02   +  09/01/20   Very light growth of Enterobacter cloacae complex               Further workup to follow including susceptibility testing             Enterobacter cloacae, Klebsiella aerogenes, and Citrobacter             freundii may develop B-lactam resistance while on therapy with             B-lactam antibiotics. Even if initially susceptible, resistance             may develop due to induction of AmpC expression by B-lactams.             Exceptions to this include cefepime and carbapenems.             [facility]committee March 2022    _____________________________________________________________________________                                E. cloacae  compl  ANTIBIOTICS                     MIC  INTRP      _____________________________________________________________________________  Amoxicillin/CA                 >16/8   R        Ampicillin                      >16    R        Ampicillin/sulbactam           >16/8   R        Aztreonam                       <=  2    S        Cefazolin                       >16    R        Cefepime                        <=1    S        Cefoxitin                       >16    R        Ceftazidime                     <=2    S        Ceftriaxone                     <=1    S        Ciprofloxacin                 <=0.25   S        Ertapenem                     <=0.25   S        Gentamicin                      <=2    S        Levofloxacin                   <=0.5   S        Meropenem                      <=0.5   S        Piperacillin/Tazobactam         4/4    S        Tetracycline                    <=2    S        Trimethoprim/Sulfamethoxazole <=0.5/9  S        _____________________________________________________________________________            S=SUSCEPTIBLE     I=INTERMEDIATE     R=RESISTANT                            N/S=NON-SUSCEPTIBLE  _____________________________________________________________________________      Microbiology additional request [[id number]] Collected: 08/25/20 0952    Order Status: Completed Specimen: Blood from Wound Updated: 08/25/20 0957    Narrative:      [id number] - Ecoli sensitivities, thanks.  ORDER#: [id number]                                    ORDERED BY: Lavona Mound  SOURCE: Wound wound                                  COLLECTED:  08/25/20 09:52  ANTIBIOTICS AT COLL.:  RECEIVED :  08/25/20 09:52  Microbiology Add On Request                FINAL       08/25/20 09:57  08/25/20   For results, see #[id number]      Culture + Gram Azzie Glatter Wound [[id number]] Collected: 08/23/20 1724    Order Status: Completed Specimen: Wound from Abrasion Updated: 08/27/20 1553     Narrative:      ORDER#: [id number]                                    ORDERED BY: Elise Benne  SOURCE: Abrasion R 2nd toe                           COLLECTED:  08/23/20 17:24  ANTIBIOTICS AT COLL.:                                RECEIVED :  08/23/20 22:27  Stain, Gram                                FINAL       08/24/20 00:32   +  08/24/20   Few WBCs             No Squamous epithelial cells seen             Many Gram positive cocci  Culture and Gram Stain, Aerobic, Wound     FINAL       08/27/20 15:53   +  08/24/20   Moderate growth of mixed cutaneous flora  08/27/20   Light growth of Escherichia coli      08/26/20   Moderate growth of Enterococcus faecalis      08/27/20   Very light growth of Klebsiella pneumoniae      _____________________________________________________________________________                                     E.coli        E.faecalis     K.pneumoniae    ANTIBIOTICS                     MIC  INTRP      MIC  INTRP      MIC  INTRP      _____________________________________________________________________________  Amoxicillin/CA                 <=4/2   S                       <=4/2   S        Ampicillin                      <=4    S         2     S        >16    R        Ampicillin/sulbactam            4/2    S  8/4    S        Aztreonam                       <=2    S                        <=2    S        Cefazolin                        2     S                         2     S        Cefepime                        <=1    S                        <=1    S        Cefoxitin                       <=4    S                        <=4    S        Ceftazidime                     <=2    S                        <=2    S        Ceftriaxone                     <=1    S                        <=1    S        Cefuroxime                      <=4    S                        <=4    S        Ciprofloxacin                 <=0.25   S                      <=0.25   S        Ertapenem                      <=0.25   S                      <=0.25   S        Gentamicin                      <=2    S                        <=  2    S        Gentamicin High Level Resistan                 <=500   S                        Levofloxacin                   <=0.5   S                       <=0.5   S        Meropenem                      <=0.5   S                       <=0.5   S        Penicillin                                       4     S                        Piperacillin/Tazobactam        <=2/4   S                        4/4    S        Streptomycin High Level Resist                <=1000   S                        Tetracycline                    <=2    S                        <=2    S        Trimethoprim/Sulfamethoxazole <=0.5/9  S                      <=0.5/9  S        Vancomycin                                       1     S                        _____________________________________________________________________________            S=SUSCEPTIBLE     I=INTERMEDIATE     R=RESISTANT                            N/S=NON-SUSCEPTIBLE  _____________________________________________________________________________      Additional Culture Request [[id number]] Collected: 08/23/20 1724    Order Status: Completed Specimen: Abrasion Updated: 08/25/20 0956    Narrative:      ORDER#: [id number]  ORDERED BY: GOWDA, MURLIYA  SOURCE: Abrasion R 2nd toe                           COLLECTED:  08/23/20 17:24  ANTIBIOTICS AT COLL.:                                RECEIVED :  08/23/20 22:27  Additional Culture Request                 FINAL       08/25/20 09:56  08/25/20   E. coli susceptibility requested by physician:             Dr. Fredia Beets on Order [phone]      MRSA culture [[id number]] Collected: 08/22/20 2133    Order Status: Completed Specimen: Culturette from Nasal Swab Updated: 08/23/20 2038     Culture MRSA Surveillance Negative for Methicillin Resistant Staph aureus    MRSA culture [[id number]]  Collected: 08/22/20 2133    Order Status: Completed Specimen: Culturette from Throat Updated: 08/23/20 2038     Culture MRSA Surveillance Negative for Methicillin Resistant Staph aureus    Culture Blood Aerobic and Anaerobic [[id number]] Collected: 08/21/20 1809    Order Status: Completed Specimen: Arm from Blood, Venipuncture Updated: 08/27/20 0321    Narrative:      ORDER#: [id number]                                    ORDERED BY: Kelby Aline  SOURCE: Blood, Venipuncture Arm                      COLLECTED:  08/21/20 18:09  ANTIBIOTICS AT COLL.:                                RECEIVED :  08/22/20 00:57  Culture Blood Aerobic and Anaerobic        FINAL       08/27/20 03:21  08/27/20   No growth after 5 days of incubation.      Culture Blood Aerobic and Anaerobic [[id number]] Collected: 08/21/20 1736    Order Status: Completed Specimen: Arm from Blood, Venipuncture Updated: 08/27/20 0321    Narrative:      ORDER#: [id number]                                    ORDERED BY: Kelby Aline  SOURCE: Blood, Venipuncture Arm                      COLLECTED:  08/21/20 17:36  ANTIBIOTICS AT COLL.:                                RECEIVED :  08/22/20 00:57  Culture Blood Aerobic and Anaerobic        FINAL       08/27/20 03:21  08/27/20   No growth after 5 days of incubation.               RADIOLOGY     Radiological Procedure personally reviewed and concur with radiologist reports unless  stated otherwise.    XR Foot Right AP And Lateral   Final Result      1. Status post dilatation of the right second toe to the level of the   PIP joint.   2. Mild lateral subluxation of the second metatarsal base suggesting   Lisfranc injury.   3. Mild degenerative arthritis in the right midfoot.      Lanier Ensign, MD    08/31/2020 7:01 PM      US Arterial / Graft Duplex Doppler Lower Extremity Right   Final Result       1. Right lower extremity: No arterial insufficiency of the right lower   extremity at rest with triphasic distal tibial waveforms  and normal   waveforms of the right second toe.   2. Left lower extremity: No arterial insufficiency of the left lower   extremity at rest.      Adelina Mings, MD    08/24/2020 10:05 AM      [place] Noninvas Low Extrem Art Dopp/Press/Wavefrms (PVR) Comp 3-4 Levels   Final Result       1. Right lower extremity: No arterial insufficiency of the right lower   extremity at rest with triphasic distal tibial waveforms and normal   waveforms of the right second toe.   2. Left lower extremity: No arterial insufficiency of the left lower   extremity at rest.      Adelina Mings, MD    08/24/2020 10:05 AM      MRI Foot Right W WO Contrast   Final Result      1.  Osseous erosion and fragmentation of the second toe distal phalanx   with abnormal marrow edema in the second toe middle and distal   phalanges, compatible with osteomyelitis.      2.  Degenerative changes of the midfoot with adjacent osseous erosions   and extensive marrow edema/enhancement. These findings are likely due to   Charcot arthropathy, although superimposed infection is in region is   difficult to exclude.      3.  Diffuse subcutaneous edema and mild enhancement throughout the foot,   especially in the second toe, compatible with cellulitis. No discrete   abscess or drainable fluid collection is identified.      These urgent results were discussed with and acknowledged by Burnett Kanaris, DO, on 08/22/2020 at 2:50 PM.      Carla Drape, MD    08/22/2020 2:50 PM      Foot Right AP Lateral And Oblique   Final Result         1.  Abnormal lucency, erosion, and fragmentation of the second toe   distal phalanx, most likely representing osteomyelitis with superimposed   pathologic fracture. There is pronounced soft tissue swelling of the   second toe and dorsal foot.      2.  Abnormal lateral subluxation of the second TMT joint and, to a   slightly lesser extent, third and fourth TMT joints, concerning for   Lisfranc ligamentous injury. There are associated degenerative  changes   and ossification along the dorsal midfoot, which may be reflective of   developing Charcot arthropathy.      3.  NOTIFICATION: The critical findings of this study were discussed   with, and acknowledged by, Dr. Kelby Aline via telephone at 6:53 PM on   08/21/2020.      Vassie Moment, MD    08/21/2020 6:54 PM  Signed,  Katy Apo, MD  11:02 AM 09/02/2020

## 2020-09-02 NOTE — Plan of Care (Signed)
Pt slept throughout the night. Pt A&Ox4, denies SOB and/or any acute distress. Pt reporting severe R. Foot pain. Pt refusing oxycodone, one time dose of Tordol administered and effective. Pt blood glucose 386 with pt refusing insulin, Dr. Georgianne Fick notified. At 2250 BP 200/78, hydralazine administered. BP rechecked at 2336 with result of 176/83. Dr Georgianne Fick notified and instructed this nurse to monitor BP and "recheck again in one hour". At 0043 BP rechecked with result of 168/81. Pt asymptomatic and not experiencing pain at that moment. VSS, call bell within reach w/ use reinforced, and safety precautions in place.   Problem: Diabetes: Glucose Imbalance  Goal: Blood glucose stable at established goal  Outcome: Not Progressing     Problem: Safety  Goal: Patient will be free from injury during hospitalization  Outcome: Progressing  Goal: Patient will be free from infection during hospitalization  Outcome: Progressing     Problem: Pain  Goal: Pain at adequate level as identified by patient  Outcome: Progressing     Problem: Side Effects from Pain Analgesia  Goal: Patient will experience minimal side effects of analgesic therapy  Outcome: Progressing     Problem: Discharge Barriers  Goal: Patient will be discharged home or other facility with appropriate resources  Outcome: Progressing     Problem: Psychosocial and Spiritual Needs  Goal: Demonstrates ability to cope with hospitalization/illness  Outcome: Progressing     Problem: Compromised Tissue integrity  Goal: Damaged tissue is healing and protected  Outcome: Progressing  Goal: Nutritional status is improving  Outcome: Progressing     Problem: Moderate/High Fall Risk Score >5  Goal: Patient will remain free of falls  Outcome: Progressing

## 2020-09-02 NOTE — Plan of Care (Addendum)
Problem: Safety  Goal: Patient will be free from injury during hospitalization  Outcome: Progressing  Flowsheets (Taken 09/02/2020 [phone])  Patient will be free from injury during hospitalization:   Assess patient's risk for falls and implement fall prevention plan of care per policy   Provide and maintain safe environment   Use appropriate transfer methods   Ensure appropriate safety devices are available at the bedside   Include patient/ family/ care giver in decisions related to safety   Hourly rounding   Assess for patients risk for elopement and implement Elopement Risk Plan per policy     Problem: Pain  Goal: Pain at adequate level as identified by patient  Outcome: Progressing  Flowsheets (Taken 09/02/2020 0937)  Pain at adequate level as identified by patient:   Identify patient comfort function goal   Assess for risk of opioid induced respiratory depression, including snoring/sleep apnea. Alert healthcare team of risk factors identified.   Assess pain on admission, during daily assessment and/or before any "as needed" intervention(s)   Reassess pain within 30-60 minutes of any procedure/intervention, per Pain Assessment, Intervention, Reassessment (AIR) Cycle   Evaluate if patient comfort function goal is met   Offer non-pharmacological pain management interventions   Evaluate patient's satisfaction with pain management progress     Problem: Compromised Tissue integrity  Goal: Nutritional status is improving  Outcome: Progressing  Flowsheets (Taken 09/02/2020 0937)  Nutritional status is improving:   Assist patient with eating   Allow adequate time for meals   Encourage patient to take dietary supplement(s) as ordered   Include patient/patient care companion in decisions related to nutrition     Problem: Moderate/High Fall Risk Score >5  Goal: Patient will remain free of falls  Outcome: Progressing  Flowsheets (Taken 09/02/2020 0937)  Moderate Risk (6-13): LOW-Fall Interventions Appropriate for Low Fall Risk      Pt axo 4. VSS, afebrile. Denies any pain. Drsg to R foot, CDI. Pt refusing Insulin, MD made aware. Voiding adequately. Safety maintained. Call light within reach.

## 2020-09-03 ENCOUNTER — Encounter: Payer: Self-pay | Admitting: Foot and Ankle Surgery

## 2020-09-03 LAB — BASIC METABOLIC PANEL
Anion Gap: 11 (ref 5.0–15.0)
BUN: 14 mg/dL (ref 9–28)
CO2: 19 mEq/L — ABNORMAL LOW (ref 22–29)
Calcium: 9.7 mg/dL (ref 8.5–10.5)
Chloride: 104 mEq/L (ref 100–111)
Creatinine: 1.1 mg/dL (ref 0.7–1.3)
Glucose: 260 mg/dL — ABNORMAL HIGH (ref 70–100)
Potassium: 4.5 mEq/L (ref 3.5–5.1)
Sodium: 134 mEq/L — ABNORMAL LOW (ref 136–145)

## 2020-09-03 LAB — CBC
Absolute NRBC: 0 10*3/uL (ref 0.00–0.00)
Hematocrit: 41.7 % (ref 37.6–49.6)
Hgb: 14.5 g/dL (ref 12.5–17.1)
MCH: 29.7 pg (ref 25.1–33.5)
MCHC: 34.8 g/dL (ref 31.5–35.8)
MCV: 85.5 fL (ref 78.0–96.0)
MPV: 8.4 fL — ABNORMAL LOW (ref 8.9–12.5)
Nucleated RBC: 0 /100 WBC (ref 0.0–0.0)
Platelets: 290 10*3/uL (ref 142–346)
RBC: 4.88 10*6/uL (ref 4.20–5.90)
RDW: 12 % (ref 11–15)
WBC: 6.69 10*3/uL (ref 3.10–9.50)

## 2020-09-03 LAB — GLUCOSE WHOLE BLOOD - POCT
Whole Blood Glucose POCT: 238 mg/dL — ABNORMAL HIGH (ref 70–100)
Whole Blood Glucose POCT: 307 mg/dL — ABNORMAL HIGH (ref 70–100)

## 2020-09-03 LAB — LAB USE ONLY - HISTORICAL SURGICAL PATHOLOGY

## 2020-09-03 LAB — GFR: EGFR: >60

## 2020-09-03 MED ORDER — ACETAMINOPHEN 325 MG PO TABS
650.0000 mg | ORAL_TABLET | Freq: Four times a day (QID) | ORAL | 0 refills | Status: AC | PRN
Start: 2020-09-03 — End: ?

## 2020-09-03 MED ORDER — RISAQUAD PO CAPS
1.0000 | ORAL_CAPSULE | Freq: Every day | ORAL | 0 refills | Status: AC
Start: 2020-09-03 — End: ?

## 2020-09-03 MED ORDER — TRAMADOL HCL 50 MG PO TABS
50.0000 mg | ORAL_TABLET | Freq: Four times a day (QID) | ORAL | 0 refills | Status: AC | PRN
Start: 2020-09-03 — End: ?

## 2020-09-03 MED ORDER — LEVOFLOXACIN 500 MG PO TABS
500.0000 mg | ORAL_TABLET | Freq: Every day | ORAL | 0 refills | Status: AC
Start: 2020-09-03 — End: 2020-09-07

## 2020-09-03 MED ORDER — DIAZEPAM 2 MG PO TABS
2.0000 mg | ORAL_TABLET | Freq: Once | ORAL | Status: AC
Start: 2020-09-03 — End: 2020-09-03
  Administered 2020-09-03: 2 mg via ORAL
  Filled 2020-09-03: qty 1

## 2020-09-03 MED ORDER — AMOXICILLIN 500 MG PO TABS: 500.0000 mg | ORAL_TABLET | Freq: Three times a day (TID) | ORAL | 0 refills | Status: AC

## 2020-09-03 NOTE — Plan of Care (Signed)
Pt slept on and off throughout the night. Pt A&Ox4, denies Sob and/or any acute distress. Pt reporting severe R. Foot pain, tordol administered and effective. At 0000 pt not able to sleep. Pt was offered melatonin, but refused. An order for 2mg  diazepam was obtained, administered, and effective. No events overnight. VSS, call bell within reach w/ use reinforced, and safety precautions in place.   Problem: Safety  Goal: Patient will be free from injury during hospitalization  Outcome: Progressing  Goal: Patient will be free from infection during hospitalization  Outcome: Progressing     Problem: Pain  Goal: Pain at adequate level as identified by patient  Outcome: Progressing     Problem: Side Effects from Pain Analgesia  Goal: Patient will experience minimal side effects of analgesic therapy  Outcome: Progressing     Problem: Discharge Barriers  Goal: Patient will be discharged home or other facility with appropriate resources  Outcome: Progressing     Problem: Psychosocial and Spiritual Needs  Goal: Demonstrates ability to cope with hospitalization/illness  Outcome: Progressing     Problem: Compromised Tissue integrity  Goal: Damaged tissue is healing and protected  Outcome: Progressing  Goal: Nutritional status is improving  Outcome: Progressing     Problem: Diabetes: Glucose Imbalance  Goal: Blood glucose stable at established goal  Outcome: Progressing     Problem: Moderate/High Fall Risk Score >5  Goal: Patient will remain free of falls  Outcome: Progressing

## 2020-09-03 NOTE — Discharge Summary (Signed)
[facility]      Patient: Daniel Hebert  Admission Date: 08/21/2020   DOB: 1960/04/22  Discharge Date: 09/03/2020    MRN: [id number]  Discharge Attending: Katy Apo MD   Referring Physician: Patsy Lager, MD  PCP: Patsy Lager, MD       DISCHARGE SUMMARY     Discharge Information   Admission Diagnosis:   Right foot cellulitis,Osteomyelitis     Discharge Diagnosis:   Patient Active Problem List    Diagnosis Date Noted    Cellulitis of left lower extremity 08/21/2020    Right foot ulcer, with necrosis of muscle 08/21/2020    Diabetic polyneuropathy associated with type 2 diabetes mellitus 08/21/2020    Acute osteomyelitis of toe, right 08/21/2020      Admission Condition: fair  Discharge Condition: stable  Functional Status: Patient is independent with mobility/ambulation, transfers, ADL's, IADL's.    Discharge Medications:     Medication List        START taking these medications      acetaminophen 325 MG tablet  Commonly known as: TYLENOL  Take 2 tablets (650 mg total) by mouth every 6 (six) hours as needed for Fever or Pain     amoxicillin 500 MG tablet  Commonly known as: AMOXIL  Take 1 tablet (500 mg total) by mouth every 8 (eight) hours for 4 days     lactobacillus/streptococcus Caps  Take 1 capsule by mouth daily     levoFLOXacin 500 MG tablet  Commonly known as: LEVAQUIN  Take 1 tablet (500 mg total) by mouth daily for 4 days     traMADol 50 MG tablet  Commonly known as: ULTRAM  Take 1 tablet (50 mg total) by mouth every 6 (six) hours as needed for Pain            CONTINUE taking these medications      CINNAMON PO     NON FORMULARY     Turmeric 500 MG Caps               Where to Get Your Medications        These medications were sent to [facility]  [address], [place] [address]      Hours: Monday - Friday 9AM to 6PM, Saturday 9AM to 3PM, closed Sunday Phone: [phone]   amoxicillin 500 MG tablet  lactobacillus/streptococcus Caps  levoFLOXacin 500 MG  tablet  traMADol 50 MG tablet       Information about where to get these medications is not yet available    Ask your nurse or doctor about these medications  acetaminophen 325 MG tablet             Hospital Course   Presentation History   Daniel Hebert is a 60 y.o. 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 Right Foot OM, DM Type II, HTN, HLD, Hx. Meningioma, GERD who presented with Right Foot Pain.     Patient reports for the past 4-5 days he has had progressive redness, pain, and swelling of his right foot. He has had chronic foot ulcer and recurrent infections in the past and has been evaluated by many different Podiatrists and Ortho Ankle/Foot physicians recently. He had MRI recently which showed chronic stable changes related to the foot and no acute OM or septic arthritis. The patient is determined not to undergo any amputations and has poor opinion of Podiatry as a Animal nutritionist  stating that they have mismanaged his condition. He was seen by Foot/Ankle Ortho Dr. Mosetta Putt who recommended coming to the hospital for IV Abx and Podiatry evaluation. In the ED, hemodynamically stable, started on Vanc+Zosyn and admitted for further management.     See HPI for details.    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 coli, Enterococcus faecalis & Klebsiella pneumoniae  7/27 wound CX Enterobacter cloacae complex & Enterococcus faecalis.  7/27 anaerobic CX CX report to follow s/p  7/29 Intra-Op CX wound CX NGTD  7/29 fungal CX NGTD  7/29 anaerobic CX pending    Per ID,since surgical cure - antibiotic transitioned to p.o. levofloxacin 500 mg p.o. q 24  & amoxicillin 500 mg q 8 hours  ( both last dose on 09/07/2020)    Patient will follow up with Podiatrist-Dr Junius Roads on 09/05/2020 for wound check and dressing changes.Dr Allena Katz personally spoke to  the patient regarding wound care/follow up plans.  WBAT Right LE in surgical shoe  Patient discharged home in a stable condition       Uncontrolled ZO:XWRUEAV has refused treatment with insulin throughout the hospital stay.Patient stated that he has been on a 'herbal regimen' to manage his diabetes and would never take insulin.     HTN:  Continue amlodipine     History of meningioma: Outpatient follow-up as needed     Hyponatremia,in the setting of hyperglycemia   Procedures/Imaging:   XR Foot Right AP And Lateral   Final Result      1. Status post dilatation of the right second toe to the level of the   PIP joint.   2. Mild lateral subluxation of the second metatarsal base suggesting   Lisfranc injury.   3. Mild degenerative arthritis in the right midfoot.      Lanier Ensign, MD    08/31/2020 7:01 PM      US Arterial / Graft Duplex Doppler Lower Extremity Right   Final Result       1. Right lower extremity: No arterial insufficiency of the right lower   extremity at rest with triphasic distal tibial waveforms and normal   waveforms of the right second toe.   2. Left lower extremity: No arterial insufficiency of the left lower   extremity at rest.      Adelina Mings, MD    08/24/2020 10:05 AM      [place] Noninvas Low Extrem Art Dopp/Press/Wavefrms (PVR) Comp 3-4 Levels   Final Result       1. Right lower extremity: No arterial insufficiency of the right lower   extremity at rest with triphasic distal tibial waveforms and normal   waveforms of the right second toe.   2. Left lower extremity: No arterial insufficiency of the left lower   extremity at rest.      Adelina Mings, MD    08/24/2020 10:05 AM      MRI Foot Right W WO Contrast   Final Result      1.  Osseous erosion and fragmentation of the second toe distal phalanx   with abnormal marrow edema in the second toe middle and distal   phalanges, compatible with osteomyelitis.      2.  Degenerative changes of the midfoot with adjacent osseous erosions   and extensive  marrow edema/enhancement. These findings are likely due to   Charcot arthropathy, although superimposed infection is in region is   difficult to exclude.      3.  Diffuse subcutaneous edema and mild enhancement throughout the foot,   especially in the second toe, compatible with cellulitis. No discrete   abscess or drainable fluid collection is identified.      These urgent results were discussed with and acknowledged by Burnett Kanaris, DO, on 08/22/2020 at 2:50 PM.      Carla Drape, MD    08/22/2020 2:50 PM      Foot Right AP Lateral And Oblique   Final Result         1.  Abnormal lucency, erosion, and fragmentation of the second toe   distal phalanx, most likely representing osteomyelitis with superimposed   pathologic fracture. There is pronounced soft tissue swelling of the   second toe and dorsal foot.      2.  Abnormal lateral subluxation of the second TMT joint and, to a   slightly lesser extent, third and fourth TMT joints, concerning for   Lisfranc ligamentous injury. There are associated degenerative changes   and ossification along the dorsal midfoot, which may be reflective of   developing Charcot arthropathy.      3.  NOTIFICATION: The critical findings of this study were discussed   with, and acknowledged by, Dr. Kelby Aline via telephone at 6:53 PM on   08/21/2020.      Vassie Moment, MD    08/21/2020 6:54 PM          Treatment Team:   Attending Provider: Katy Apo, MD  Consulting Physician: Golden Circle, MD         Progress Note/Physical Exam at Discharge     Subjective: AAO x 3,NAD    Vitals:    09/03/20 1610 09/03/20 0739 09/03/20 0931 09/03/20 1209   BP: 146/90 144/85 (!) 163/95 (!) 154/93   Pulse: 88 81  83   Resp: 16 17  17    Temp: 98 F (36.7 C) 97.5 F (36.4 C)  97.7 F (36.5 C)   TempSrc: Oral Oral  Oral   SpO2: 98% 97%  97%   Weight:       Height:         GEN APPEARANCE: Alert and oriented x 3  CVS: RRR,  LUNGS: CTAB; No Wheezes; No Rhonchi: No rales  Vascular: Dorsalis pedis pulses  are palpable  ABD: Soft; No TTP  EXT: Right foot dressing intact  NEURO: Nonfocal.  Psych: Anxious,tendency towards tangential conversations       Diagnostics     Labs/Studies Pending at Discharge: No    Last Labs   Recent Labs   Lab 09/03/20  0554 08/28/20  0547   WBC 6.69 6.42   RBC 4.88 5.04   Hgb 14.5 14.9   Hematocrit 41.7 43.6   MCV 85.5 86.5   Platelets 290 333       Recent Labs   Lab 09/03/20  0554 09/01/20  2043 08/30/20  0614 08/28/20  1003 08/28/20  0547   Sodium 134* 133* 134*  --  133*   Potassium 4.5 4.4 4.7 4.7 5.7*   Chloride 104 98* 100  --  101   CO2 19* 19* 24  --  24   BUN 14 14 14   --  13   Creatinine 1.1 1.2 1.2  --  1.2   Glucose 260* 397*  268*  --  277*   Calcium 9.7 9.8 10.1  --  9.9   Magnesium  --   --   --   --  2.0       Microbiology Results (last 15 days)       Procedure Component Value Units Date/Time    Culture, Anaerobic Bacteria [[id number]] Collected: 08/31/20 1700    Order Status: Sent Specimen: Other from Wound Updated: 08/31/20 2041    Fungal Culture & Smear [[id number]] Collected: 08/31/20 1700    Order Status: Completed Specimen: Other from Tissue Updated: 09/01/20 1616    Narrative:      ORDER#: [id number]                                    ORDERED BY: Allena Katz, SHRUTI  SOURCE: Tissue right foot - second toe               COLLECTED:  08/31/20 17:00  ANTIBIOTICS AT COLL.:                                RECEIVED :  08/31/20 20:41  Stain, Fungal                              FINAL       09/01/20 16:16  09/01/20   No Fungal or Yeast Elements Seen  Culture Fungus                             PENDING      Culture + Gram Stain,Aerobic, Wound [[id number]] Collected: 08/31/20 1700    Order Status: Completed Specimen: Wound from Incision Updated: 09/02/20 1744    Narrative:      ORDER#: [id number]                                    ORDERED BY: Allena Katz, SHRUTI  SOURCE: Incision right foot - second toe             COLLECTED:  08/31/20 17:00  ANTIBIOTICS AT COLL.:                                 RECEIVED :  08/31/20 20:41  Stain, Gram                                FINAL       08/31/20 22:11  08/31/20   No WBCs seen             No Squamous epithelial cells seen             No organisms seen  Culture and Gram Stain, Aerobic, Wound     PRELIM      09/02/20 17:44  09/01/20   Culture no growth to date, Final report to follow  09/02/20   Culture no growth to date, Final report to follow      Culture + Gram Azzie Glatter Wound [[id number]] Collected: 08/29/20 1816    Order Status: Completed Specimen: Wound from Bone Updated: 09/02/20 1602    Narrative:  ORDER#: [id number]                                    ORDERED BY: PATEL, SHRUTI  SOURCE: Bone Right second intermediate phala         COLLECTED:  08/29/20 18:16  ANTIBIOTICS AT COLL.:                                RECEIVED :  08/30/20 01:11  Stain, Gram                                FINAL       08/30/20 05:18  08/30/20   No WBCs or organisms seen             No Squamous epithelial cells seen  Culture and Gram Stain, Aerobic, Wound     FINAL       09/02/20 16:02   +  09/01/20   From liquid media only Enterobacter cloacae complex               Refer to susceptibilities on culture #[id number]        Culture, Anaerobic Bacteria [[id number]] Collected: 08/29/20 1816    Order Status: Completed Specimen: Other from Bone Updated: 09/02/20 0227    Narrative:      ORDER#: [id number]                                    ORDERED BY: Allena Katz, SHRUTI  SOURCE: Bone Right second intermediate phala         COLLECTED:  08/29/20 18:16  ANTIBIOTICS AT COLL.:                                RECEIVED :  08/30/20 01:11  Culture, Anaerobic Bacteria                PRELIM      09/02/20 02:27  09/02/20   Culture report to follow      Culture + Gram Azzie Glatter, Wound [[id number]] Collected: 08/29/20 1810    Order Status: Completed Specimen: Wound from Bone Updated: 09/02/20 1601    Narrative:      ORDER#: [id number]                                    ORDERED BY: PATEL, SHRUTI  SOURCE: Bone  Right second distal phalanx bon         COLLECTED:  08/29/20 18:10  ANTIBIOTICS AT COLL.:                                RECEIVED :  08/30/20 01:11  Stain, Gram                                FINAL       08/30/20 05:17  08/30/20   No WBCs or organisms seen             No Squamous epithelial cells  seen  Culture and Gram Stain, Aerobic, Wound     FINAL       09/02/20 16:01   +  09/01/20   Very light growth of Enterobacter cloacae complex               Enterobacter cloacae, Klebsiella aerogenes, and Citrobacter             freundii may develop B-lactam resistance while on therapy with             B-lactam antibiotics. Even if initially susceptible, resistance             may develop due to induction of AmpC expression by B-lactams.             Exceptions to this include cefepime and carbapenems.             [facility] March 2022    09/02/20   Very light growth of Enterococcus faecalis      _____________________________________________________________________________                                E. cloacae compl   E.faecalis     ANTIBIOTICS                     MIC  INTRP      MIC  INTRP      _____________________________________________________________________________  Amoxicillin/CA                 >16/8   R                        Ampicillin                      >16    R         1     S        Ampicillin/sulbactam           >16/8   R                        Aztreonam                       <=2    S                        Cefazolin                       >16    R                        Cefepime                        <=1    S                        Cefoxitin                       >16    R                        Ceftazidime                     <=  2    S                        Ceftriaxone                     <=1    S                        Ciprofloxacin                 <=0.25   S                        Ertapenem                     <=0.25   S                        Gentamicin                       <=2    S                        Gentamicin High Level Resistan                 <=500   S        Levofloxacin                   <=0.5   S                        Meropenem                      <=0.5   S                        Penicillin                                       4     S        Piperacillin/Tazobactam         4/4    S                        Streptomycin High Level Resist                <=1000   S        Tetracycline                    <=2    S                        Trimethoprim/Sulfamethoxazole <=0.5/9  S                        Vancomycin                                       1     S        _____________________________________________________________________________  S=SUSCEPTIBLE     I=INTERMEDIATE     R=RESISTANT                            N/S=NON-SUSCEPTIBLE  _____________________________________________________________________________      Culture, Anaerobic Bacteria [[id number]] Collected: 08/29/20 1810    Order Status: Completed Specimen: Other from Bone Updated: 09/02/20 0227    Narrative:      ORDER#: [id number]                                    ORDERED BY: PATEL, SHRUTI  SOURCE: Bone Right second distal phalanx bon         COLLECTED:  08/29/20 18:10  ANTIBIOTICS AT COLL.:                                RECEIVED :  08/30/20 01:11  Culture, Anaerobic Bacteria                PRELIM      09/02/20 02:27  09/02/20   Culture report to follow      Culture, Anaerobic Bacteria [[id number]] Collected: 08/29/20 1805    Order Status: Completed Specimen: Other from Ulcer Updated: 09/02/20 0226    Narrative:      ORDER#: [id number]                                    ORDERED BY: PATEL, SHRUTI  SOURCE: Ulcer Right second toe culture               COLLECTED:  08/29/20 18:05  ANTIBIOTICS AT COLL.:                                RECEIVED :  08/30/20 00:57  Culture, Anaerobic Bacteria                PRELIM      09/02/20 02:26  09/02/20   Culture report to follow      Culture + Gram Azzie Glatter Wound  [[id number]] Collected: 08/29/20 1805    Order Status: Completed Specimen: Wound from Ulcer Updated: 09/02/20 1106    Narrative:      ORDER#: [id number]                                    ORDERED BY: Allena Katz, SHRUTI  SOURCE: Ulcer Right second toe culture               COLLECTED:  08/29/20 18:05  ANTIBIOTICS AT COLL.:                                RECEIVED :  08/30/20 00:57  Stain, Gram                                FINAL       08/30/20 03:26  08/30/20   Few WBCs             No organisms seen  No Squamous epithelial cells seen  Culture and Gram Stain, Aerobic, Wound     FINAL       09/02/20 11:06   +  09/01/20   Very light growth of Enterobacter cloacae complex               Enterobacter cloacae, Klebsiella aerogenes, and Citrobacter             freundii may develop B-lactam resistance while on therapy with             B-lactam antibiotics. Even if initially susceptible, resistance             may develop due to induction of AmpC expression by B-lactams.             Exceptions to this include cefepime and carbapenems.             [facility] March 2022    _____________________________________________________________________________                                E. cloacae compl  ANTIBIOTICS                     MIC  INTRP      _____________________________________________________________________________  Amoxicillin/CA                 >16/8   R        Ampicillin                      >16    R        Ampicillin/sulbactam           >16/8   R        Aztreonam                       <=2    S        Cefazolin                       >16    R        Cefepime                        <=1    S        Cefoxitin                       >16    R        Ceftazidime                     <=2    S        Ceftriaxone                     <=1    S        Ciprofloxacin                 <=0.25   S        Ertapenem                     <=0.25   S        Gentamicin                      <=  2    S         Levofloxacin                   <=0.5   S        Meropenem                      <=0.5   S        Piperacillin/Tazobactam         4/4    S        Tetracycline                    <=2    S        Trimethoprim/Sulfamethoxazole <=0.5/9  S        _____________________________________________________________________________            S=SUSCEPTIBLE     I=INTERMEDIATE     R=RESISTANT                            N/S=NON-SUSCEPTIBLE  _____________________________________________________________________________      Microbiology additional request [[id number]] Collected: 08/25/20 0952    Order Status: Completed Specimen: Blood from Wound Updated: 08/25/20 0957    Narrative:      [id number] - Ecoli sensitivities, thanks.  ORDER#: [id number]                                    ORDERED BY: Lavona Mound  SOURCE: Wound wound                                  COLLECTED:  08/25/20 09:52  ANTIBIOTICS AT COLL.:                                RECEIVED :  08/25/20 09:52  Microbiology Add On Request                FINAL       08/25/20 09:57  08/25/20   For results, see #[id number]      Culture + Gram Azzie Glatter Wound [[id number]] Collected: 08/23/20 1724    Order Status: Completed Specimen: Wound from Abrasion Updated: 08/27/20 1553    Narrative:      ORDER#: [id number]                                    ORDERED BY: Elise Benne  SOURCE: Abrasion R 2nd toe                           COLLECTED:  08/23/20 17:24  ANTIBIOTICS AT COLL.:                                RECEIVED :  08/23/20 22:27  Stain, Gram                                FINAL       08/24/20 00:32   +  08/24/20   Few WBCs  No Squamous epithelial cells seen             Many Gram positive cocci  Culture and Gram Stain, Aerobic, Wound     FINAL       08/27/20 15:53   +  08/24/20   Moderate growth of mixed cutaneous flora  08/27/20   Light growth of Escherichia coli      08/26/20   Moderate growth of Enterococcus faecalis      08/27/20   Very light growth of Klebsiella  pneumoniae      _____________________________________________________________________________                                     E.coli        E.faecalis     K.pneumoniae    ANTIBIOTICS                     MIC  INTRP      MIC  INTRP      MIC  INTRP      _____________________________________________________________________________  Amoxicillin/CA                 <=4/2   S                       <=4/2   S        Ampicillin                      <=4    S         2     S        >16    R        Ampicillin/sulbactam            4/2    S                        8/4    S        Aztreonam                       <=2    S                        <=2    S        Cefazolin                        2     S                         2     S        Cefepime                        <=1    S                        <=1    S        Cefoxitin                       <=4    S                        <=  4    S        Ceftazidime                     <=2    S                        <=2    S        Ceftriaxone                     <=1    S                        <=1    S        Cefuroxime                      <=4    S                        <=4    S        Ciprofloxacin                 <=0.25   S                      <=0.25   S        Ertapenem                     <=0.25   S                      <=0.25   S        Gentamicin                      <=2    S                        <=2    S        Gentamicin High Level Resistan                 <=500   S                        Levofloxacin                   <=0.5   S                       <=0.5   S        Meropenem                      <=0.5   S                       <=0.5   S        Penicillin                                       4     S                        Piperacillin/Tazobactam        <=  2/4   S                        4/4    S        Streptomycin High Level Resist                <=1000   S                        Tetracycline                    <=2    S                        <=2    S         Trimethoprim/Sulfamethoxazole <=0.5/9  S                      <=0.5/9  S        Vancomycin                                       1     S                        _____________________________________________________________________________            S=SUSCEPTIBLE     I=INTERMEDIATE     R=RESISTANT                            N/S=NON-SUSCEPTIBLE  _____________________________________________________________________________      Additional Culture Request [[id number]] Collected: 08/23/20 1724    Order Status: Completed Specimen: Abrasion Updated: 08/25/20 0956    Narrative:      ORDER#: [id number]                                    ORDERED BY: Elise Benne  SOURCE: Abrasion R 2nd toe                           COLLECTED:  08/23/20 17:24  ANTIBIOTICS AT COLL.:                                RECEIVED :  08/23/20 22:27  Additional Culture Request                 FINAL       08/25/20 09:56  08/25/20   E. coli susceptibility requested by physician:             Dr. Fredia Beets on Order [phone]      MRSA culture [[id number]] Collected: 08/22/20 2133    Order Status: Completed Specimen: Culturette from Nasal Swab Updated: 08/23/20 2038     Culture MRSA Surveillance Negative for Methicillin Resistant Staph aureus    MRSA culture [[id number]] Collected: 08/22/20 2133    Order Status: Completed Specimen: Culturette from Throat Updated: 08/23/20 2038     Culture MRSA Surveillance Negative for Methicillin Resistant Staph aureus    Culture Blood Aerobic and Anaerobic [[id number]] Collected: 08/21/20 1809    Order Status: Completed Specimen:  Arm from Blood, Venipuncture Updated: 08/27/20 0321    Narrative:      ORDER#: [id number]                                    ORDERED BY: Kelby Aline  SOURCE: Blood, Venipuncture Arm                      COLLECTED:  08/21/20 18:09  ANTIBIOTICS AT COLL.:                                RECEIVED :  08/22/20 00:57  Culture Blood Aerobic and Anaerobic        FINAL       08/27/20 03:21  08/27/20   No  growth after 5 days of incubation.      Culture Blood Aerobic and Anaerobic [[id number]] Collected: 08/21/20 1736    Order Status: Completed Specimen: Arm from Blood, Venipuncture Updated: 08/27/20 0321    Narrative:      ORDER#: [id number]                                    ORDERED BY: Kelby Aline  SOURCE: Blood, Venipuncture Arm                      COLLECTED:  08/21/20 17:36  ANTIBIOTICS AT COLL.:                                RECEIVED :  08/22/20 00:57  Culture Blood Aerobic and Anaerobic        FINAL       08/27/20 03:21  08/27/20   No growth after 5 days of incubation.               Patient Instructions   Discharge Diet: cardiac diet and diabetic diet  Discharge Activity:  activity as tolerated    Follow Up Appointment:   Follow-up Information       Allena Katz, Shruti A, DPM Follow up on 09/05/2020.    Specialty: Foot and Ankle Surgery  Why: Follow up on 09/05/2020 for wound check/dressing change  Contact information:  [address]  [place] [address]  [phone]               Follow up with PCP in [facility] Follow up.               [place], MD Follow up.    Specialties: Infectious Disease, Internal Medicine  Why: As needed  Contact information:  [address]  [address]  [place] [address]  [phone]                              Time spent examining patient, discussing with patient/family regarding hospital course, chart review, reconciling medications and discharge planning: 45 minutes.    Signed,  Katy Apo, MD  2:26 PM 09/03/2020

## 2020-09-03 NOTE — Consults (Signed)
Psychiatry Progress Note    Patient name:  Daniel Hebert, Daniel Hebert  Date of admission:  08/21/2020  Date of birth:  1960/12/06                       Date of consultation:  09/03/2020  Age:  60 y.o.                                     Attending/Referring Physician:  Katy Apo, MD  MRN:  [id number]                                   Consulting Physician:  Violeta Gelinas, MD  CSN:  [id number]                                    Guardian/POA: Friend      Reason for Admission    Foot pain, cellulitis of lower extremity,    Active Hospital Problems    Diagnosis    Cellulitis of left lower extremity    Right foot ulcer, with necrosis of muscle    Diabetic polyneuropathy associated with type 2 diabetes mellitus    Acute osteomyelitis of toe, right       Reason for Initial Consultation    Evaluation for capacity, refusing medication management and care, patient is found to have denies in his room    I. History     Interval History  Daniel Hebert is a 60 y.o. 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 Right Foot OM, DM Type II, HTN, HLD, Hx. Meningioma, GERD who presented with Right Foot Pain.     Patient reports for the past 4-5 days he has had progressive redness, pain, and swelling of his right foot. He has had chronic foot ulcer and recurrent infections in the past and has been evaluated by many different Podiatrists and Ortho Ankle/Foot physicians recently. He had MRI recently which showed chronic stable changes related to the foot and no acute OM or septic arthritis. The patient is determined not to undergo any amputations and has poor opinion of Podiatry as a field stating that they have mismanaged his condition. He was seen by Foot/Ankle Ortho Dr. Mosetta Putt who recommended coming to the hospital for IV Abx and Podiatry evaluation. In the ED, hemodynamically stable, started on Vanc+Zosyn and admitted for further management. On my discussion with the patient, he was very  resistant to having Podiatry consult, but on further discussion, has agreed to evaluation by them in the AM.  Patient reportedly drove to this area from [place] with his dog    Patient reportedly drove from [place] to this area with his dog in the car left the dog in the car and heart son of couple days while he is in the hospital.  The dog is subsequently rescued and currently in the pond.  Police charges are pending     WG:[id number] smoking tobacco, drinking alcohol, or using illicit drugs.  His ex-wife lives in his    A psychiatric consult was initiated as patient is refusing treatment angry agitated paranoid delusional agitated demanding argumentative and a capacity evaluation is recommended.  Patient is willing to take IV antibiotics  Does not want any p.o. medications for diabetes mellitus, hypertension, any other issues    Medical history is significant for hypertensive cardiovascular disorder, right lower extremity cellulitis, infection, osteomyelitis, diabetes mellitus, hypercholesterolemia, history of brain tumor /meningioma, GERD, poor self-care    09/03/2020  Interval history  Patient is alert oriented to person place and time  Eating well sleeping well  Patient is not suicidal not homicidal cooperative contract for safety  Discharge plans and outpatient follow-up plans recommended when medically stable  Patient may be discharged home when medically stable with outpatient follow-up plans  Plan for discharge today with outpatient follow-up plans and case management services    Chief Complaint:   Mood swings impaired insight and judgment, agitation paranoia severe thought disorder impaired insight and judgment unable to care for himself  Additional History and Collateral Information:   No new additional history and collateral information could be obtained at this time  Medical Review of Systems  Poorly cooperative  All other pertinent Review of Systems negative.    Allergies   Allergen Reactions     Metformin Other (See Comments)    Mirtazapine Other (See Comments)    Molds & Smuts      difficulity breathing     Current Facility-Administered Medications   Medication Dose Route Frequency    amLODIPine  10 mg Oral Daily    cadexomer iodine   Topical Daily    carboxymethylcellulose (PF)  1 drop Both Eyes QID    docusate sodium  100 mg Oral Daily    heparin (porcine)  5,000 Units Subcutaneous Q12H SCH    insulin glargine  20 Units Subcutaneous QHS    insulin lispro  1-3 Units Subcutaneous QHS    insulin lispro  1-5 Units Subcutaneous TID AC    insulin lispro  3 Units Subcutaneous Once    piperacillin-tazobactam  4.5 g Intravenous Q8H    polyethylene glycol  17 g Oral Daily         II. Examination   Vital signs reviewed:   Blood pressure (!) 154/93, pulse 83, temperature 97.7 F (36.5 C), temperature source Oral, resp. rate 17, height 1.905 m (6\' 3" ), weight 100.7 kg (222 lb), SpO2 97 %.           The physical exam performed by the medical team reviewed.    Laboratory Data and Diagnostic Studies  Lab Results last 48 Hours       Procedure Component Value Units Date/Time    Glucose Whole Blood - POCT [[id number]]  (Abnormal) Collected: 08/23/20 1113     Updated: 08/23/20 1118     Whole Blood Glucose POCT 313 mg/dL     Magnesium [[id number]] Collected: 08/23/20 0618    Specimen: Blood Updated: 08/23/20 0811     Magnesium 1.9 mg/dL     Comprehensive metabolic panel [[id number]]  (Abnormal) Collected: 08/23/20 0618    Specimen: Blood Updated: 08/23/20 0811     Glucose 274 mg/dL      BUN 11 mg/dL      Creatinine 0.9 mg/dL      Calcium 9.0 mg/dL      Sodium 433 mEq/L      Potassium 4.3 mEq/L      Chloride 105 mEq/L      CO2 22 mEq/L      Anion Gap 8.0     Protein, Total 6.3 g/dL      Albumin 3.0 g/dL      AST (SGOT) 18 U/L  ALT 30 U/L      Alkaline Phosphatase 81 U/L      Bilirubin, Total 0.7 mg/dL      Globulin 3.3 g/dL      Albumin/Globulin Ratio 0.9    GFR [[id number]] Collected: 08/23/20 0618     Updated: 08/23/20  0811     EGFR >60.0       Glucose Whole Blood - POCT [[id number]]  (Abnormal) Collected: 08/23/20 0729     Updated: 08/23/20 0744     Whole Blood Glucose POCT 247 mg/dL     CBC without differential [[id number]]  (Abnormal) Collected: 08/23/20 0618    Specimen: Blood Updated: 08/23/20 0651     WBC 4.15 x10 3/uL      Hgb 13.3 g/dL      Hematocrit 08.6 %      Platelets 231 x10 3/uL      RBC 4.63 x10 6/uL      MCV 86.4 fL      MCH 28.7 pg      MCHC 33.3 g/dL      RDW 12 %      MPV 8.7 fL      Nucleated RBC 0.0 /100 WBC      Absolute NRBC 0.00 x10 3/uL     Culture Blood Aerobic and Anaerobic [[id number]] Collected: 08/21/20 1809    Specimen: Arm from Blood, Venipuncture Updated: 08/23/20 0121    Narrative:      ORDER#: [id number]                                    ORDERED BY: Kelby Aline  SOURCE: Blood, Venipuncture Arm                      COLLECTED:  08/21/20 18:09  ANTIBIOTICS AT COLL.:                                RECEIVED :  08/22/20 00:57  Culture Blood Aerobic and Anaerobic        PRELIM      08/23/20 01:21  08/23/20   No Growth after 1 day/s of incubation.      Culture Blood Aerobic and Anaerobic [[id number]] Collected: 08/21/20 1736    Specimen: Arm from Blood, Venipuncture Updated: 08/23/20 0121    Narrative:      ORDER#: [id number]                                    ORDERED BY: Kelby Aline  SOURCE: Blood, Venipuncture Arm                      COLLECTED:  08/21/20 17:36  ANTIBIOTICS AT COLL.:                                RECEIVED :  08/22/20 00:57  Culture Blood Aerobic and Anaerobic        PRELIM      08/23/20 01:21  08/23/20   No Growth after 1 day/s of incubation.      MRSA culture [[id number]] Collected: 08/22/20 2133    Specimen: Culturette from Nasal Swab Updated: 08/23/20 0014    MRSA culture [[id number]] Collected: 08/22/20 2133  Specimen: Culturette from Throat Updated: 08/23/20 0014    Glucose Whole Blood - POCT [[id number]]  (Abnormal) Collected: 08/22/20 2107     Updated: 08/22/20 2122     Whole  Blood Glucose POCT 329 mg/dL     Glucose Whole Blood - POCT [[id number]]  (Abnormal) Collected: 08/22/20 1527     Updated: 08/22/20 1543     Whole Blood Glucose POCT 303 mg/dL     Hemoglobin G2X [[id number]]  (Abnormal) Collected: 08/22/20 0609    Specimen: Blood Updated: 08/22/20 0945     Hemoglobin A1C 8.7 %      Average Estimated Glucose 203.0 mg/dL     Glucose Whole Blood - POCT [[id number]]  (Abnormal) Collected: 08/22/20 0749     Updated: 08/22/20 0818     Whole Blood Glucose POCT 224 mg/dL     Basic Metabolic Panel [[id number]]  (Abnormal) Collected: 08/22/20 0609    Specimen: Blood Updated: 08/22/20 0749     Glucose 217 mg/dL      BUN 13 mg/dL      Creatinine 0.8 mg/dL      Calcium 9.0 mg/dL      Sodium 595 mEq/L      Potassium 3.8 mEq/L      Chloride 104 mEq/L      CO2 22 mEq/L      Anion Gap 10.0    Magnesium [[id number]] Collected: 08/22/20 0609    Specimen: Blood Updated: 08/22/20 0749     Magnesium 1.9 mg/dL     GFR [[id number]] Collected: 08/22/20 0609     Updated: 08/22/20 0749     EGFR >60.0       CBC without differential [[id number]] Collected: 08/22/20 0609    Specimen: Blood Updated: 08/22/20 0722     WBC 5.08 x10 3/uL      Hgb 13.1 g/dL      Hematocrit 09.3 %      Platelets 219 x10 3/uL      RBC 4.58 x10 6/uL      MCV 86.2 fL      MCH 28.6 pg      MCHC 33.2 g/dL      RDW 13 %      MPV 8.9 fL      Nucleated RBC 0.0 /100 WBC      Absolute NRBC 0.00 x10 3/uL     PT/APTT [[id number]]  (Abnormal) Collected: 08/22/20 0609     Updated: 08/22/20 0720     PT 15.6 sec      PT INR 1.3     PTT 27 sec     Glucose Whole Blood - POCT [[id number]]  (Abnormal) Collected: 08/21/20 2127     Updated: 08/21/20 2143     Whole Blood Glucose POCT 342 mg/dL     Sedimentation rate (ESR) [[id number]]  (Abnormal) Collected: 08/21/20 1736    Specimen: Blood Updated: 08/21/20 1802     Sed Rate 38 mm/Hr     Comprehensive metabolic panel [[id number]]  (Abnormal) Collected: 08/21/20 1736    Specimen: Blood Updated: 08/21/20 1801      Glucose 301 mg/dL      BUN 21 mg/dL      Creatinine 1.1 mg/dL      Sodium 626 mEq/L      Potassium 4.0 mEq/L      Chloride 100 mEq/L      CO2 25 mEq/L      Calcium 9.9 mg/dL      Protein, Total 7.2 g/dL  Albumin 3.5 g/dL      AST (SGOT) 25 U/L      ALT 40 U/L      Alkaline Phosphatase 99 U/L      Bilirubin, Total 0.4 mg/dL      Globulin 3.7 g/dL      Albumin/Globulin Ratio 0.9     Anion Gap 11.0    GFR [[id number]] Collected: 08/21/20 1736     Updated: 08/21/20 1801     EGFR >60.0       Lactic Acid [[id number]] Collected: 08/21/20 1735    Specimen: Blood Updated: 08/21/20 1753     Lactic Acid 1.4 mmol/L     Narrative:      Cancel second order for specimen if the initial level is less  than 2.37mEq/L    CBC and differential [[id number]]  (Abnormal) Collected: 08/21/20 1736    Specimen: Blood Updated: 08/21/20 1746     WBC 5.20 x10 3/uL      Hgb 14.5 g/dL      Hematocrit 08.6 %      Platelets 236 x10 3/uL      RBC 4.92 x10 6/uL      MCV 86.0 fL      MCH 29.5 pg      MCHC 34.3 g/dL      RDW 12 %      MPV 8.7 fL      Neutrophils 64.9 %      Lymphocytes Automated 18.1 %      Monocytes 13.7 %      Eosinophils Automated 1.9 %      Basophils Automated 0.8 %      Immature Granulocytes 0.6 %      Nucleated RBC 0.0 /100 WBC      Neutrophils Absolute 3.38 x10 3/uL      Lymphocytes Absolute Automated 0.94 x10 3/uL      Monocytes Absolute Automated 0.71 x10 3/uL      Eosinophils Absolute Automated 0.10 x10 3/uL      Basophils Absolute Automated 0.04 x10 3/uL      Immature Granulocytes Absolute 0.03 x10 3/uL      Absolute NRBC 0.00 x10 3/uL           Last EKG Result       None          No results found for any visits on 08/21/20.    III. Assessment and Plan (Medical Decision Making)       Daniel Hebert is a 60 y.o. 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 Right Foot OM, DM Type II, HTN, HLD, Hx. Meningioma, GERD who presented with Right Foot Pain.     Patient reports for the past 4-5 days  he has had progressive redness, pain, and swelling of his right foot. He has had chronic foot ulcer and recurrent infections in the past and has been evaluated by many different Podiatrists and Ortho Ankle/Foot physicians recently. He had MRI recently which showed chronic stable changes related to the foot and no acute OM or septic arthritis. The patient is determined not to undergo any amputations and has poor opinion of Podiatry as a field stating that they have mismanaged his condition. He was seen by Foot/Ankle Ortho Dr. Mosetta Putt who recommended coming to the hospital for IV Abx and Podiatry evaluation. In the ED, hemodynamically stable, started on Vanc+Zosyn and admitted for further management. On my discussion with the patient, he was  very resistant to having Podiatry consult, but on further discussion, has agreed to evaluation by them in the AM.    Patient reportedly drove from [place] to this area with his dog in the car left the dog in the car and heart son of couple days while he is in the hospital.  The dog is subsequently rescued and currently in the pond.  Police charges are pending     UX:NATFTD smoking tobacco, drinking alcohol, or using illicit drugs.  His ex-wife lives in his    A psychiatric consult was initiated as patient is refusing treatment angry agitated paranoid delusional agitated demanding argumentative and a capacity evaluation is recommended.  Patient is willing to take IV antibiotics  Does not want any p.o. medications for diabetes mellitus, hypertension, any other issues    Medical history is significant for hypertensive cardiovascular disorder, right lower extremity cellulitis, infection, osteomyelitis, diabetes mellitus, hypercholesterolemia, history of brain tumor /meningioma, GERD, poor self-care  MRI of the foot indicates     IMPRESSION:  1.  Osseous erosion and fragmentation of the second toe distal phalanx  with abnormal marrow edema in the second toe middle and  distal  phalanges, compatible with osteomyelitis.  2.  Degenerative changes of the midfoot with adjacent osseous erosions  and extensive marrow edema/enhancement. 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  3.  Diffuse subcutaneous edema and mild enhancement throughout the foot,  especially in the second toe, compatible with cellulitis. No discrete  abscess or drainable fluid collection is identified.     These urgent results were discussed with and acknowledged by Burnett Kanaris, DO, on 08/22/2020 at 2:50 PM.   Carla Drape, MD   08/22/2020 2:50 PM     Knives were discovered in his room and were removed by the hospital staff    Well-developed single Caucasian male  Rapid pressured speech anxious angry irritable  Severe thought disorder was noted  Patient denies suicidal homicidal ideations cooperative contract for safety  Patient is not a suicidal risk not a homicidal risk at this time  Patient denies hearing voices denies suicidal homicidal ideations denies any dangerousness at this time  Patient is willing to take IV antibiotics however patient is refusing p.o. 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  Axis IV moderate  Axis V 20-25    Disposition Recommendation:     Medication management  Medical stabilization  Supportive care  Patient is cooperative and willing to take IV antibiotics at this time  Discharge plans and case  management services recommended when medically stable  Patient is not a suicidal risk not a homicidal risk at this time patient may be discharged when medically stable  Discharge plans and case management services when medically stable recommended  patient is stable safe contract for safety  Case management services and discharge plans recommended      Signed by: Violeta Gelinas, MD  09/03/2020  4:05 PM    *This note was generated by the Epic EMR system/ Dragon speech recognition and may contain inherent errors or omissions not intended by the user. Grammatical errors, random word insertions, deletions, pronoun errors and incomplete sentences are occasional consequences of this technology due to software limitations. Not all errors are caught or corrected. If there are questions or concerns about the content of this note or information contained within the body of this dictation they should be addressed directly with the author for clarification

## 2020-09-03 NOTE — Progress Notes (Signed)
Pt was wheeled off the unit. No belonging left behind. Spoke with Energy manager. The transporter will take patient to stop by the security dept to pick up pt's pocket knife.

## 2020-09-03 NOTE — Plan of Care (Signed)
BRIEF ID NOTE    Chart reviewed  Afebrile; no leukocytosis    7/21 wound CX E. coli, Enterococcus faecalis & Klebsiella pneumoniae    7/27 wound CX Enterobacter cloacae complex & Enterococcus faecalis.  7/27 anaerobic CX CX report to follow s/p    7/29 Intra-Op CX wound CX NGTD  7/29 fungal CX NGTD  7/29 anaerobic CX pending      PT now partial amputation right second toe on 7/29  Per podiatry felt grossly clean margins obtained      On Zosyn.        Since surgical cure - ok to  discharge today  on p.o. levofloxacin 500 mg p.o. q 24  & amoxicillin 500 mg q 8 hours  ( both last dose on 8/5)    He can follow up with his primary care/ his physicians  in [facility] [place], [place]

## 2020-09-03 NOTE — Discharge Instr - AVS First Page (Addendum)
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

## 2020-09-03 NOTE — Progress Notes (Signed)
Pt is alert and oriented x 4, vss. BP somewhat elevated, pt stated that he is very upset of his situation and worries about his dog. Pt had a shower w/o wet his R LE in the afternoon. Throughout the shift, pt refused to have the insulin sliding scale coverage based on his BG level. Discharge information given to pt. He verbalized the correct information back. Waiting for the wheelchair now.

## 2020-09-05 NOTE — Progress Notes (Signed)
Pt aware that his new court date is August 15th at 930am per court clerk at [phone].  Pt has court ph number as above and aware of court date. Pt original court date was canceled due to being in hospital. Pt voiced understanding.     Dudley Major RN  Case Management

## 2020-09-06 LAB — LAB USE ONLY - HISTORICAL SURGICAL PATHOLOGY
